# Patient Record
Sex: Female | Born: 1990 | Race: Black or African American | Hispanic: No | Marital: Married | Smoking: Former smoker
Health system: Southern US, Community
[De-identification: ages and names within clinical notes are randomized; demographics above are authoritative.]

## PROBLEM LIST (undated history)

## (undated) ENCOUNTER — Inpatient Hospital Stay (HOSPITAL_COMMUNITY): Payer: Self-pay

## (undated) DIAGNOSIS — D649 Anemia, unspecified: Secondary | ICD-10-CM

## (undated) DIAGNOSIS — Z8619 Personal history of other infectious and parasitic diseases: Secondary | ICD-10-CM

## (undated) HISTORY — DX: Anemia, unspecified: D64.9

## (undated) HISTORY — DX: Personal history of other infectious and parasitic diseases: Z86.19

---
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
## 2010-06-18 ENCOUNTER — Emergency Department (HOSPITAL_COMMUNITY): Admission: EM | Admit: 2010-06-18 | Discharge: 2010-06-18 | Payer: Self-pay | Admitting: Emergency Medicine

## 2010-07-31 ENCOUNTER — Emergency Department (HOSPITAL_COMMUNITY)
Admission: EM | Admit: 2010-07-31 | Discharge: 2010-08-01 | Payer: Self-pay | Source: Home / Self Care | Admitting: Emergency Medicine

## 2010-10-02 LAB — DIFFERENTIAL
Basophils Absolute: 0 10*3/uL (ref 0.0–0.1)
Basophils Relative: 1 % (ref 0–1)
Eosinophils Absolute: 0.1 10*3/uL (ref 0.0–0.7)
Monocytes Absolute: 0.4 10*3/uL (ref 0.1–1.0)
Neutro Abs: 1.8 10*3/uL (ref 1.7–7.7)
Neutrophils Relative %: 43 % (ref 43–77)

## 2010-10-02 LAB — COMPREHENSIVE METABOLIC PANEL
ALT: 13 U/L (ref 0–35)
Alkaline Phosphatase: 79 U/L (ref 39–117)
BUN: 10 mg/dL (ref 6–23)
Chloride: 104 mEq/L (ref 96–112)
Glucose, Bld: 93 mg/dL (ref 70–99)
Potassium: 4 mEq/L (ref 3.5–5.1)
Sodium: 138 mEq/L (ref 135–145)
Total Bilirubin: 0.4 mg/dL (ref 0.3–1.2)

## 2010-10-02 LAB — CBC
HCT: 36.1 % (ref 36.0–46.0)
Hemoglobin: 12.1 g/dL (ref 12.0–15.0)
MCV: 89.4 fL (ref 78.0–100.0)
RBC: 4.04 MIL/uL (ref 3.87–5.11)
WBC: 4.2 10*3/uL (ref 4.0–10.5)

## 2010-10-02 LAB — URINALYSIS, ROUTINE W REFLEX MICROSCOPIC
Bilirubin Urine: NEGATIVE
Glucose, UA: NEGATIVE mg/dL
Hgb urine dipstick: NEGATIVE
Specific Gravity, Urine: 1.028 (ref 1.005–1.030)
pH: 6 (ref 5.0–8.0)

## 2010-10-02 LAB — LIPASE, BLOOD: Lipase: 24 U/L (ref 11–59)

## 2010-11-01 LAB — CBC
HCT: 31.6 % — ABNORMAL LOW (ref 36.0–49.0)
Hemoglobin: 10.6 g/dL — ABNORMAL LOW (ref 12.0–16.0)
Hemoglobin: 9.8 g/dL — ABNORMAL LOW (ref 12.0–16.0)
MCHC: 33.6 g/dL (ref 31.0–37.0)
MCV: 95.7 fL (ref 78.0–98.0)
RBC: 3.05 MIL/uL — ABNORMAL LOW (ref 3.80–5.70)
RDW: 14.9 % (ref 11.4–15.5)

## 2010-11-07 ENCOUNTER — Emergency Department (HOSPITAL_COMMUNITY)
Admission: EM | Admit: 2010-11-07 | Discharge: 2010-11-08 | Disposition: A | Discharge status: Home / Self Care | Payer: Self-pay | Attending: Emergency Medicine | Admitting: Emergency Medicine

## 2010-11-07 DIAGNOSIS — R1013 Epigastric pain: Secondary | ICD-10-CM | POA: Insufficient documentation

## 2010-11-07 DIAGNOSIS — R509 Fever, unspecified: Secondary | ICD-10-CM | POA: Insufficient documentation

## 2010-11-07 DIAGNOSIS — IMO0001 Reserved for inherently not codable concepts without codable children: Secondary | ICD-10-CM | POA: Insufficient documentation

## 2010-11-07 LAB — URINALYSIS, ROUTINE W REFLEX MICROSCOPIC
Bilirubin Urine: NEGATIVE
Glucose, UA: NEGATIVE mg/dL
Hgb urine dipstick: NEGATIVE
Ketones, ur: NEGATIVE mg/dL
Nitrite: NEGATIVE
Protein, ur: NEGATIVE mg/dL
Specific Gravity, Urine: 1.024 (ref 1.005–1.030)
Urobilinogen, UA: 1 mg/dL (ref 0.0–1.0)
pH: 6 (ref 5.0–8.0)

## 2010-11-07 LAB — COMPREHENSIVE METABOLIC PANEL WITH GFR
ALT: 16 U/L (ref 0–35)
AST: 20 U/L (ref 0–37)
Albumin: 3.6 g/dL (ref 3.5–5.2)
Alkaline Phosphatase: 81 U/L (ref 39–117)
BUN: 7 mg/dL (ref 6–23)
CO2: 30 meq/L (ref 19–32)
Calcium: 9.5 mg/dL (ref 8.4–10.5)
Chloride: 107 meq/L (ref 96–112)
Creatinine, Ser: 0.89 mg/dL (ref 0.4–1.2)
GFR calc non Af Amer: >60 mL/min
Glucose, Bld: 93 mg/dL (ref 70–99)
Potassium: 4.5 meq/L (ref 3.5–5.1)
Sodium: 140 meq/L (ref 135–145)
Total Bilirubin: 0.2 mg/dL — ABNORMAL LOW (ref 0.3–1.2)
Total Protein: 7.4 g/dL (ref 6.0–8.3)

## 2010-11-07 LAB — DIFFERENTIAL
Basophils Absolute: 0 K/uL (ref 0.0–0.1)
Basophils Relative: 1 % (ref 0–1)
Eosinophils Absolute: 0.2 K/uL (ref 0.0–0.7)
Eosinophils Relative: 3 % (ref 0–5)
Lymphocytes Relative: 44 % (ref 12–46)
Lymphs Abs: 3.2 K/uL (ref 0.7–4.0)
Monocytes Absolute: 0.8 K/uL (ref 0.1–1.0)
Monocytes Relative: 11 % (ref 3–12)
Neutro Abs: 3 K/uL (ref 1.7–7.7)
Neutrophils Relative %: 41 % — ABNORMAL LOW (ref 43–77)

## 2010-11-07 LAB — CBC
HCT: 35.8 % — ABNORMAL LOW (ref 36.0–46.0)
Hemoglobin: 12.2 g/dL (ref 12.0–15.0)
MCH: 30 pg (ref 26.0–34.0)
MCHC: 34.1 g/dL (ref 30.0–36.0)
MCV: 88.2 fL (ref 78.0–100.0)
Platelets: 276 K/uL (ref 150–400)
RBC: 4.06 MIL/uL (ref 3.87–5.11)
RDW: 13.1 % (ref 11.5–15.5)
WBC: 7.2 K/uL (ref 4.0–10.5)

## 2010-11-07 LAB — LIPASE, BLOOD: Lipase: 27 U/L (ref 11–59)

## 2010-11-07 LAB — POCT PREGNANCY, URINE: Preg Test, Ur: NEGATIVE

## 2010-11-08 LAB — WET PREP, GENITAL
WBC, Wet Prep HPF POC: NONE SEEN
Yeast Wet Prep HPF POC: NONE SEEN
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
## 2011-04-12 LAB — URINALYSIS, ROUTINE W REFLEX MICROSCOPIC
Glucose, UA: NEGATIVE
Hgb urine dipstick: NEGATIVE
Protein, ur: 30 — AB
pH: 6

## 2011-04-12 LAB — POCT PREGNANCY, URINE
Operator id: 26520
Preg Test, Ur: NEGATIVE

## 2011-04-12 LAB — WET PREP, GENITAL: Clue Cells Wet Prep HPF POC: NONE SEEN

## 2011-04-12 LAB — URINE MICROSCOPIC-ADD ON

## 2012-02-24 ENCOUNTER — Emergency Department (HOSPITAL_COMMUNITY)
Admission: EM | Admit: 2012-02-24 | Discharge: 2012-02-24 | Disposition: A | Discharge status: Home / Self Care | Payer: Self-pay | Attending: Emergency Medicine | Admitting: Emergency Medicine

## 2012-02-24 ENCOUNTER — Encounter (HOSPITAL_COMMUNITY): Payer: Self-pay

## 2012-02-24 DIAGNOSIS — J020 Streptococcal pharyngitis: Secondary | ICD-10-CM | POA: Insufficient documentation

## 2012-02-24 LAB — RAPID STREP SCREEN (MED CTR MEBANE ONLY): Streptococcus, Group A Screen (Direct): POSITIVE — AB

## 2012-02-24 MED ORDER — PENICILLIN G BENZATHINE 1200000 UNIT/2ML IM SUSP
1.2000 10*6.[IU] | Freq: Once | INTRAMUSCULAR | Status: AC
  Administered 2012-02-24: 1.2 10*6.[IU] via INTRAMUSCULAR
  Filled 2012-02-24 (×2): qty 2

## 2012-02-24 MED ORDER — OXYCODONE-ACETAMINOPHEN 5-325 MG PO TABS: 2.0000 | ORAL_TABLET | ORAL | Status: AC | PRN

## 2012-02-24 NOTE — ED Provider Notes (Signed)
History  This chart was scribed for Susan Guppy, MD by Shari Heritage. The patient was seen in room TR02C/TR02C. Patient's care was started at 1446.     CSN: [id number]  Arrival date & time 02/24/12  1446   First MD Initiated Contact with Patient 02/24/12 1601      Chief Complaint  Patient presents with  . URI  . Sore Throat  . Cough  . Hemoptysis    (Consider location/radiation/quality/duration/timing/severity/associated sxs/prior treatment) The history is provided by the patient. No language interpreter was used.    Susan Everett is a 21 y.o. female who presents to the Emergency Department complaining of persistent, productive cough and sore throat onset 1.5 weeks ago. Patient says that she has produced green and bloody sputum. Associated symptoms include body aches, trouble swallowing, chills, and diaphoresis. Patient denies fever. Patient reports no other significant past medical, surgical or family history. Patient is a former smoker.   History  Substance Use Topics  . Smoking status: Former Games developer  . Smokeless tobacco: Not on file  . Alcohol Use: No    OB History    Grav Para Term Preterm Abortions TAB SAB Ect Mult Living                  Review of Systems  Constitutional: Positive for chills and diaphoresis. Negative for fever.  HENT: Positive for sore throat and trouble swallowing.   Respiratory: Positive for cough.     Allergies  Review of patient's allergies indicates not on file.  Home Medications  No current outpatient prescriptions on file.  BP 117/61  Pulse 65  Temp 98.1 F (36.7 C) (Oral)  Resp 18  SpO2 98%  Physical Exam  HENT:  Nose: Right sinus exhibits no maxillary sinus tenderness and no frontal sinus tenderness. Left sinus exhibits no maxillary sinus tenderness and no frontal sinus tenderness.       No sinus tenderness. Tonsils are erythematous, more on the right than left. There is right-sided tonsil tenderness. White pus is  present on right tonsils.  Cardiovascular: Normal rate and regular rhythm.   No murmur heard. Pulmonary/Chest: Effort normal. She has no wheezes. She has no rhonchi. She has no rales.    ED Course  Procedures (including critical care time) DIAGNOSTIC STUDIES: Oxygen Saturation is 98% on room air, normal by my interpretation.    COORDINATION OF CARE: 4:45pm- Patient informed of current plan for treatment and evaluation and agrees with plan at this time. Patient's strep screen was positive for strep throat. Will administer Bicillin injection and discharge patient home with a prescription for Percocet 3-525 mg.     Results for orders placed during the hospital encounter of 02/24/12  RAPID STREP SCREEN      Component Value Range   Streptococcus, Group A Screen (Direct) POSITIVE (*) NEGATIVE     No results found.   No diagnosis found.    MDM  Strep throat Nontoxic.  No evidence of peritonsillar abscess.  No distress      I personally performed the services described in this documentation, which was scribed in my presence. The recorded information has been reviewed and considered.     Susan Guppy, MD 02/24/12 [phone]

## 2012-02-24 NOTE — ED Notes (Signed)
Pt compalisn of body aches, sore throat, pain with swallowing, cough and cold cymptoms.

## 2014-04-12 ENCOUNTER — Encounter (HOSPITAL_COMMUNITY): Payer: Self-pay | Admitting: Emergency Medicine

## 2014-04-12 ENCOUNTER — Emergency Department (HOSPITAL_COMMUNITY)
Admission: EM | Admit: 2014-04-12 | Discharge: 2014-04-12 | Disposition: A | Discharge status: Home / Self Care | Payer: Self-pay | Attending: Emergency Medicine | Admitting: Emergency Medicine

## 2014-04-12 DIAGNOSIS — Z87891 Personal history of nicotine dependence: Secondary | ICD-10-CM | POA: Insufficient documentation

## 2014-04-12 DIAGNOSIS — IMO0002 Reserved for concepts with insufficient information to code with codable children: Secondary | ICD-10-CM | POA: Insufficient documentation

## 2014-04-12 DIAGNOSIS — Y92410 Unspecified street and highway as the place of occurrence of the external cause: Secondary | ICD-10-CM | POA: Insufficient documentation

## 2014-04-12 DIAGNOSIS — Y9389 Activity, other specified: Secondary | ICD-10-CM | POA: Insufficient documentation

## 2014-04-12 DIAGNOSIS — M546 Pain in thoracic spine: Secondary | ICD-10-CM

## 2014-04-12 MED ORDER — HYDROCODONE-ACETAMINOPHEN 5-325 MG PO TABS
2.0000 | ORAL_TABLET | Freq: Once | ORAL | Status: DC
  Filled 2014-04-12: qty 2

## 2014-04-12 NOTE — ED Provider Notes (Signed)
Medical screening examination/treatment/procedure(s) were performed by non-physician practitioner and as supervising physician I was immediately available for consultation/collaboration.   EKG Interpretation None       Doug Sou, MD 04/12/14 1623

## 2014-04-12 NOTE — Discharge Instructions (Signed)
Take ibuprofen or tylenol as needed for pain. Refer to attached documents for more information.
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
## 2014-12-18 ENCOUNTER — Encounter (HOSPITAL_COMMUNITY): Payer: Self-pay | Admitting: Emergency Medicine

## 2014-12-18 ENCOUNTER — Emergency Department (HOSPITAL_COMMUNITY)
Admission: EM | Admit: 2014-12-18 | Discharge: 2014-12-19 | Disposition: A | Discharge status: Home / Self Care | Payer: BC Managed Care – PPO | Attending: Emergency Medicine | Admitting: Emergency Medicine

## 2014-12-18 DIAGNOSIS — R51 Headache: Secondary | ICD-10-CM | POA: Diagnosis not present

## 2014-12-18 DIAGNOSIS — R202 Paresthesia of skin: Secondary | ICD-10-CM | POA: Insufficient documentation

## 2014-12-18 DIAGNOSIS — R1013 Epigastric pain: Secondary | ICD-10-CM | POA: Insufficient documentation

## 2014-12-18 DIAGNOSIS — R0789 Other chest pain: Secondary | ICD-10-CM | POA: Diagnosis not present

## 2014-12-18 DIAGNOSIS — R109 Unspecified abdominal pain: Secondary | ICD-10-CM

## 2014-12-18 DIAGNOSIS — Z87891 Personal history of nicotine dependence: Secondary | ICD-10-CM | POA: Insufficient documentation

## 2014-12-18 DIAGNOSIS — R079 Chest pain, unspecified: Secondary | ICD-10-CM | POA: Diagnosis present

## 2014-12-18 DIAGNOSIS — R519 Headache, unspecified: Secondary | ICD-10-CM

## 2014-12-18 NOTE — ED Notes (Addendum)
Pt arrived to the ED with a complaint of feeling "weird."  Pt states her chest is a tight sensation. Pt denies pain radiation.  Pt's pain is located central chest. Pain has been intermittent.  Pt also is complaining of a headache

## 2014-12-19 ENCOUNTER — Emergency Department (HOSPITAL_COMMUNITY): Payer: BC Managed Care – PPO

## 2014-12-19 LAB — COMPREHENSIVE METABOLIC PANEL
ALT: 19 U/L (ref 14–54)
ANION GAP: 4 — AB (ref 5–15)
AST: 15 U/L (ref 15–41)
Albumin: 3.7 g/dL (ref 3.5–5.0)
Alkaline Phosphatase: 79 U/L (ref 38–126)
BILIRUBIN TOTAL: 0.5 mg/dL (ref 0.3–1.2)
BUN: 9 mg/dL (ref 6–20)
CALCIUM: 8.6 mg/dL — AB (ref 8.9–10.3)
CHLORIDE: 107 mmol/L (ref 101–111)
CO2: 26 mmol/L (ref 22–32)
Creatinine, Ser: 0.79 mg/dL (ref 0.44–1.00)
GLUCOSE: 98 mg/dL (ref 65–99)
POTASSIUM: 3.8 mmol/L (ref 3.5–5.1)
Sodium: 137 mmol/L (ref 135–145)
Total Protein: 6.6 g/dL (ref 6.5–8.1)

## 2014-12-19 LAB — CBC WITH DIFFERENTIAL/PLATELET
Basophils Absolute: 0 10*3/uL (ref 0.0–0.1)
Basophils Relative: 0 % (ref 0–1)
EOS PCT: 2 % (ref 0–5)
Eosinophils Absolute: 0.1 10*3/uL (ref 0.0–0.7)
HCT: 31.9 % — ABNORMAL LOW (ref 36.0–46.0)
HEMOGLOBIN: 10.6 g/dL — AB (ref 12.0–15.0)
Lymphocytes Relative: 54 % — ABNORMAL HIGH (ref 12–46)
Lymphs Abs: 2.4 10*3/uL (ref 0.7–4.0)
MCH: 29.1 pg (ref 26.0–34.0)
MCHC: 33.2 g/dL (ref 30.0–36.0)
MCV: 87.6 fL (ref 78.0–100.0)
MONOS PCT: 10 % (ref 3–12)
Monocytes Absolute: 0.4 10*3/uL (ref 0.1–1.0)
NEUTROS ABS: 1.5 10*3/uL — AB (ref 1.7–7.7)
NEUTROS PCT: 34 % — AB (ref 43–77)
Platelets: 190 10*3/uL (ref 150–400)
RBC: 3.64 MIL/uL — ABNORMAL LOW (ref 3.87–5.11)
RDW: 13.5 % (ref 11.5–15.5)
WBC: 4.4 10*3/uL (ref 4.0–10.5)

## 2014-12-19 LAB — D-DIMER, QUANTITATIVE: D-Dimer, Quant: <0.27 ug/mL-FEU (ref 0.00–0.48)

## 2014-12-19 LAB — TROPONIN I

## 2014-12-19 NOTE — Discharge Instructions (Signed)
Your tests tonight are normal, no heart attack, blood clots, pneumonia, abnormal kidney or liver tests or electrolyte abnormalities. You can take acetaminophen for your headache which shouldn't bother your stomach. Recheck if you feel worse.

## 2014-12-19 NOTE — ED Provider Notes (Signed)
CSN: [id number]     Arrival date & time 12/18/14  2203 History   First MD Initiated Contact with Patient 12/18/14 2359     Chief Complaint  Patient presents with  . Chest Pain     (Consider location/radiation/quality/duration/timing/severity/associated sxs/prior Treatment) HPI  Patient is here with several different complaints. She reports headache off and on for the past week. The headaches occur daily and last all day. She states currently the headache is in her center forehead. She also gets in the back of her head. She describes the pain as pressure. Nontender when she palpates it. She also complains of some swelling behind her left ear that got very swollen and now the swelling went away. She denies any visual changes or numbness or tingling of her extremities. She does state she gets tingling in her legs with her. That has gone on for a long time but not tonight.  Patient states about 2 hours prior to arrival she had chest pain that was just to the right of center. She described it as pressure and it would come and go lasting a few minutes at a time. Nothing made it feel worse, nothing made it feel better. She denies cough and states once she may have had some shortness of breath. She states she felt like she was having palpitations earlier. She denies nausea or vomiting. After the chest pain started she had some epigastric pain described as sharp that comes and goes.  Patient states she was taken aspirin for her headache earlier in the week and started getting diarrhea and diffuse burning abdominal pain. This lasted for about 3 hours. It is not present now.   PCP none  History reviewed. No pertinent past medical history. History reviewed. No pertinent past surgical history. History reviewed. No pertinent family history. History  Substance Use Topics  . Smoking status: Former Smoker    Quit date: 07/22/2010  . Smokeless tobacco: Not on file  . Alcohol Use: No   employed  OB  History    No data available     Review of Systems  All other systems reviewed and are negative.     Allergies  Review of patient's allergies indicates no known allergies.  Home Medications   Prior to Admission medications   Not on File   Pt denies being on BCP's  BP 116/69 mmHg  Pulse 69  Temp(Src) 97.6 F (36.4 C) (Oral)  Resp 18  Ht 5\' 4"  (1.626 m)  Wt 152 lb (68.947 kg)  BMI 26.08 kg/m2  SpO2 100%  LMP 12/12/2014 (Approximate)  Vital signs normal   Physical Exam  Constitutional: She is oriented to person, place, and time. She appears well-developed and well-nourished.  Non-toxic appearance. She does not appear ill. No distress.  HENT:  Head: Normocephalic and atraumatic.    Right Ear: External ear normal.  Left Ear: External ear normal.  Nose: Nose normal. No mucosal edema or rhinorrhea.  Mouth/Throat: Oropharynx is clear and moist and mucous membranes are normal. No dental abscesses or uvula swelling.  Area of headache noted  Patient's postauricular area was inspected bilaterally. They feel the same with the same bony prominences. She states it was very swollen and gone now. We discussed it may have been a cyst because of lymph node would not get large and go away that quickly.  Eyes: Conjunctivae and EOM are normal. Pupils are equal, round, and reactive to light.  Neck: Normal range of motion and full passive range  of motion without pain. Neck supple.  Cardiovascular: Normal rate, regular rhythm and normal heart sounds.  Exam reveals no gallop and no friction rub.   No murmur heard. Pulmonary/Chest: Effort normal and breath sounds normal. No respiratory distress. She has no wheezes. She has no rhonchi. She has no rales. She exhibits tenderness. She exhibits no crepitus.    Abdominal: Soft. Normal appearance and bowel sounds are normal. She exhibits no distension. There is no tenderness. There is no rebound and no guarding.  Musculoskeletal: Normal range of  motion. She exhibits no edema or tenderness.  Moves all extremities well.   Neurological: She is alert and oriented to person, place, and time. She has normal strength. No cranial nerve deficit.  Skin: Skin is warm, dry and intact. No rash noted. No erythema. No pallor.  Psychiatric: She has a normal mood and affect. Her speech is normal and behavior is normal. Her mood appears not anxious.  Nursing note and vitals reviewed.   ED Course  Procedures (including critical care time)  Pt refused any kind of medication because of the aspirin upsetting her stomach earlier this week. She doesn't want the migraine cocktail which I explained was nausea medication.  Pt given her test results, she again refuses to try any medications.   Labs Review Results for orders placed or performed during the hospital encounter of 12/18/14  Comprehensive metabolic panel  Result Value Ref Range   Sodium 137 135 - 145 mmol/L   Potassium 3.8 3.5 - 5.1 mmol/L   Chloride 107 101 - 111 mmol/L   CO2 26 22 - 32 mmol/L   Glucose, Bld 98 65 - 99 mg/dL   BUN 9 6 - 20 mg/dL   Creatinine, Ser 1.61 0.44 - 1.00 mg/dL   Calcium 8.6 (L) 8.9 - 10.3 mg/dL   Total Protein 6.6 6.5 - 8.1 g/dL   Albumin 3.7 3.5 - 5.0 g/dL   AST 15 15 - 41 U/L   ALT 19 14 - 54 U/L   Alkaline Phosphatase 79 38 - 126 U/L   Total Bilirubin 0.5 0.3 - 1.2 mg/dL   GFR calc non Af Amer >60 >60 mL/min   GFR calc Af Amer >60 >60 mL/min   Anion gap 4 (L) 5 - 15  CBC with Differential  Result Value Ref Range   WBC 4.4 4.0 - 10.5 K/uL   RBC 3.64 (L) 3.87 - 5.11 MIL/uL   Hemoglobin 10.6 (L) 12.0 - 15.0 g/dL   HCT 09.6 (L) 04.5 - 40.9 %   MCV 87.6 78.0 - 100.0 fL   MCH 29.1 26.0 - 34.0 pg   MCHC 33.2 30.0 - 36.0 g/dL   RDW 81.1 91.4 - 78.2 %   Platelets 190 150 - 400 K/uL   Neutrophils Relative % 34 (L) 43 - 77 %   Neutro Abs 1.5 (L) 1.7 - 7.7 K/uL   Lymphocytes Relative 54 (H) 12 - 46 %   Lymphs Abs 2.4 0.7 - 4.0 K/uL   Monocytes Relative 10  3 - 12 %   Monocytes Absolute 0.4 0.1 - 1.0 K/uL   Eosinophils Relative 2 0 - 5 %   Eosinophils Absolute 0.1 0.0 - 0.7 K/uL   Basophils Relative 0 0 - 1 %   Basophils Absolute 0.0 0.0 - 0.1 K/uL  Troponin I  Result Value Ref Range   Troponin I <0.03 <0.031 ng/mL  D-dimer, quantitative  Result Value Ref Range   D-Dimer, Quant <0.27 0.00 -  0.48 ug/mL-FEU   Laboratory interpretation all normal except mild anemia       Imaging Review Dg Chest 2 View  12/19/2014   CLINICAL DATA:  Central non radiating chest pain for 1 day.  EXAM: CHEST  2 VIEW  COMPARISON:  None.  FINDINGS: The cardiomediastinal contours are normal. The lungs are clear. Pulmonary vasculature is normal. No consolidation, pleural effusion, or pneumothorax. No acute osseous abnormalities are seen.  IMPRESSION: No acute pulmonary process.   Electronically Signed   By: Rubye Oaks M.D.   On: 12/19/2014 03:04     EKG Interpretation   Date/Time:  Sunday Dec 18 2014 22:11:37 EDT Ventricular Rate:  64 PR Interval:  159 QRS Duration: 103 QT Interval:  389 QTC Calculation: 401 R Axis:   81 Text Interpretation:  Sinus rhythm Normal ECG No old tracing to compare  Confirmed by Cheryel Kyte  MD-I, Averie Hornbaker ([id number]) on 12/19/2014 2:23:39 AM        MDM   Final diagnoses:  Headache, unspecified headache type  Chest pain, atypical  Abdominal pain, unspecified abdominal location    Plan discharge   Devoria Albe, MD, Concha Pyo, MD 12/19/14 [phone]

## 2016-04-04 LAB — OB RESULTS CONSOLE GC/CHLAMYDIA
Chlamydia: NEGATIVE
Gonorrhea: NEGATIVE

## 2016-04-04 LAB — OB RESULTS CONSOLE RUBELLA ANTIBODY, IGM: Rubella: IMMUNE

## 2016-04-04 LAB — OB RESULTS CONSOLE ANTIBODY SCREEN: Antibody Screen: NEGATIVE

## 2016-04-04 LAB — OB RESULTS CONSOLE ABO/RH: RH TYPE: NEGATIVE

## 2016-04-04 LAB — OB RESULTS CONSOLE HIV ANTIBODY (ROUTINE TESTING): HIV: NONREACTIVE

## 2016-04-04 LAB — OB RESULTS CONSOLE HEPATITIS B SURFACE ANTIGEN: Hepatitis B Surface Ag: NEGATIVE

## 2016-04-04 LAB — OB RESULTS CONSOLE RPR: RPR: NONREACTIVE

## 2016-07-22 NOTE — L&D Delivery Note (Signed)
Delivery Note At 7:49 PM a viable female was delivered via Vaginal, Spontaneous Delivery (Presentation: vtx; LOA ).  APGAR: 9, 9; weight pending.   Placenta status: spontaneous, intact.  Cord:  with the following complications: none.  Some postpartum bleeding responded to uterine massage and pitocin.  No perineal or vaginal lacerations, also able to visualize entire cervix-no lacs.  Anesthesia:  Epidural Episiotomy: None Lacerations: None Suture Repair: none Est. Blood Loss (mL): 400  Mom to postpartum.  Baby to Couplet care / Skin to Skin.  Owais Pruett D 10/14/2016, 8:24 PM

## 2016-08-05 ENCOUNTER — Ambulatory Visit (HOSPITAL_BASED_OUTPATIENT_CLINIC_OR_DEPARTMENT_OTHER): Payer: BC Managed Care – PPO

## 2016-08-05 ENCOUNTER — Ambulatory Visit (HOSPITAL_BASED_OUTPATIENT_CLINIC_OR_DEPARTMENT_OTHER): Payer: BC Managed Care – PPO | Admitting: Oncology

## 2016-08-05 ENCOUNTER — Telehealth: Payer: Self-pay | Admitting: Oncology

## 2016-08-05 VITALS — BP 135/59 | HR 91 | Temp 98.1°F | Ht 64.0 in | Wt 216.7 lb

## 2016-08-05 DIAGNOSIS — D649 Anemia, unspecified: Secondary | ICD-10-CM

## 2016-08-05 DIAGNOSIS — O99013 Anemia complicating pregnancy, third trimester: Secondary | ICD-10-CM

## 2016-08-05 LAB — CBC WITH DIFFERENTIAL/PLATELET
BASO%: 0.1 % (ref 0.0–2.0)
Basophils Absolute: 0 10*3/uL (ref 0.0–0.1)
EOS%: 1.2 % (ref 0.0–7.0)
Eosinophils Absolute: 0.1 10*3/uL (ref 0.0–0.5)
HEMATOCRIT: 28.3 % — AB (ref 34.8–46.6)
HGB: 9.3 g/dL — ABNORMAL LOW (ref 11.6–15.9)
LYMPH#: 1.4 10*3/uL (ref 0.9–3.3)
LYMPH%: 16.5 % (ref 14.0–49.7)
MCH: 29.4 pg (ref 25.1–34.0)
MCHC: 32.9 g/dL (ref 31.5–36.0)
MCV: 89.6 fL (ref 79.5–101.0)
MONO#: 0.7 10*3/uL (ref 0.1–0.9)
MONO%: 8 % (ref 0.0–14.0)
NEUT%: 74.2 % (ref 38.4–76.8)
NEUTROS ABS: 6.3 10*3/uL (ref 1.5–6.5)
Platelets: 194 10*3/uL (ref 145–400)
RBC: 3.16 10*6/uL — ABNORMAL LOW (ref 3.70–5.45)
RDW: 15 % — ABNORMAL HIGH (ref 11.2–14.5)
WBC: 8.5 10*3/uL (ref 3.9–10.3)

## 2016-08-05 LAB — FERRITIN: Ferritin: 11 ng/ml (ref 9–269)

## 2016-08-05 LAB — COMPREHENSIVE METABOLIC PANEL
ALBUMIN: 2.8 g/dL — AB (ref 3.5–5.0)
ALK PHOS: 148 U/L (ref 40–150)
ALT: 20 U/L (ref 0–55)
AST: 15 U/L (ref 5–34)
Anion Gap: 8 mEq/L (ref 3–11)
BUN: 6.1 mg/dL — ABNORMAL LOW (ref 7.0–26.0)
CALCIUM: 8.8 mg/dL (ref 8.4–10.4)
CO2: 23 mEq/L (ref 22–29)
Chloride: 108 mEq/L (ref 98–109)
Creatinine: 0.7 mg/dL (ref 0.6–1.1)
GLUCOSE: 99 mg/dL (ref 70–140)
POTASSIUM: 3.8 meq/L (ref 3.5–5.1)
SODIUM: 138 meq/L (ref 136–145)
Total Bilirubin: 0.29 mg/dL (ref 0.20–1.20)
Total Protein: 6.5 g/dL (ref 6.4–8.3)

## 2016-08-05 LAB — IRON AND TIBC
%SAT: 9 % — ABNORMAL LOW (ref 21–57)
IRON: 41 ug/dL (ref 41–142)
TIBC: 451 ug/dL — AB (ref 236–444)
UIBC: 410 ug/dL — ABNORMAL HIGH (ref 120–384)

## 2016-08-05 NOTE — Telephone Encounter (Signed)
GAVE PATIENT AVS REPORT AND APPOINTMENTS FOR January. PATIENT BACK TO LAB.

## 2016-08-05 NOTE — Progress Notes (Signed)
[facility] Cancer Initial Visit:  Patient Care Team: No Pcp Per Patient as PCP - General (General Practice)  CHIEF COMPLAINTS/PURPOSE OF CONSULTATION:  Anemia in pregnancy  HISTORY OF PRESENTING ILLNESS: Susan Everett 25 y.o. female presented today for evaluation of anemia in the setting of pregnancy. Patient is currently in her seventh month of pregnancy. She went to see her OB recently for follow-up exam and had a CBC performed on 07/26/16 which demonstrated a hemoglobin 9.1 g/dL, hematocrit 16.1%28.6%, MCV 90. Patient has a history of anemia, her previous CBC from 12/19/14 demonstrated hemoglobin 10.6 g/dL, hematocrit 09.6%31.9%, MCV 87.6. She denies any evidence of bleeding including melena, hematochezia, hematuria, hemoptysis. She is currently not on any iron supplement. She is supposed to be taking a prenatal vitamin however due to difficulty swallowing the pill, she is taking  Flintstone vitamins instead. She denies any family or personal history of thalassemia or sickle cell disease/trait. She denies any fatigue. She states she does get short of breath when she lays flat but denies any dyspnea on exertion or chest pain. Today she complains of right hip pain which has been going off for the past few weeks.  Review of Systems  Constitutional: Negative for appetite change, chills, fatigue and fever.  HENT:   Negative for hearing loss, lump/mass, mouth sores, sore throat and tinnitus.   Eyes: Negative for eye problems and icterus.  Respiratory: Negative for chest tightness, cough, hemoptysis, shortness of breath and wheezing.        SOB while laying flat.  Cardiovascular: Negative for chest pain, leg swelling and palpitations.  Gastrointestinal: Negative for abdominal distention, abdominal pain, blood in stool, diarrhea, nausea and vomiting.  Endocrine: Negative.  Negative for hot flashes.  Genitourinary: Negative for difficulty urinating, frequency and hematuria.   Musculoskeletal:  Negative for arthralgias and neck pain.       +right hip pain  Skin: Negative for itching and rash.  Neurological: Negative for dizziness, headaches and speech difficulty.  Hematological: Negative for adenopathy. Does not bruise/bleed easily.  Psychiatric/Behavioral: Negative for confusion. The patient is not nervous/anxious.     MEDICAL HISTORY: No past medical history on file.  SURGICAL HISTORY: No past surgical history on file.  SOCIAL HISTORY: Social History   Social History  . Marital status: Single    Spouse name: N/A  . Number of children: N/A  . Years of education: N/A   Occupational History  . Not on file.   Social History Main Topics  . Smoking status: Former Smoker    Quit date: 07/22/2010  . Smokeless tobacco: Not on file  . Alcohol use No  . Drug use: No  . Sexual activity: Yes    Birth control/ protection: None   Other Topics Concern  . Not on file   Social History Narrative  . No narrative on file    FAMILY HISTORY No family history on file.  ALLERGIES:  has No Known Allergies.  MEDICATIONS:  Current Outpatient Prescriptions  Medication Sig Dispense Refill  . Prenatal Vit-Fe Fumarate-FA (PRENATAL MULTIVITAMIN) TABS tablet Take 1 tablet by mouth daily at 12 noon.     No current facility-administered medications for this visit.     PHYSICAL EXAMINATION:    Vitals:   08/05/16 1317  BP: (!) 135/59  Pulse: 91  Temp: 98.1 F (36.7 C)    Filed Weights   08/05/16 1317  Weight: 216 lb 11.2 oz (98.3 kg)     Physical Exam  Constitutional:  She is oriented to person, place, and time and well-developed, well-nourished, and in no distress. No distress.  HENT:  Head: Normocephalic and atraumatic.  Mouth/Throat: No oropharyngeal exudate.  Eyes: Conjunctivae are normal. Pupils are equal, round, and reactive to light. No scleral icterus.  Neck: Normal range of motion. Neck supple. No JVD present.  Cardiovascular: Normal rate, regular rhythm  and normal heart sounds.  Exam reveals no gallop and no friction rub.   No murmur heard. Pulmonary/Chest: Breath sounds normal. No respiratory distress. She has no wheezes. She has no rales.  Abdominal: Soft. Bowel sounds are normal. She exhibits no distension. There is no tenderness. There is no guarding.  Gravid abdomen  Musculoskeletal: She exhibits no edema or tenderness.  Lymphadenopathy:    She has no cervical adenopathy.  Neurological: She is alert and oriented to person, place, and time. No cranial nerve deficit.  Skin: Skin is warm and dry. No rash noted. No erythema. No pallor.  Psychiatric: Affect and judgment normal.     LABORATORY DATA: I have personally reviewed the data as listed:  Appointment on 08/05/2016  Component Date Value Ref Range Status  . WBC 08/05/2016 8.5  3.9 - 10.3 10e3/uL Final  . NEUT# 08/05/2016 6.3  1.5 - 6.5 10e3/uL Final  . HGB 08/05/2016 9.3* 11.6 - 15.9 g/dL Final  . HCT 04/54/0981 28.3* 34.8 - 46.6 % Final  . Platelets 08/05/2016 194  145 - 400 10e3/uL Final  . MCV 08/05/2016 89.6  79.5 - 101.0 fL Final  . MCH 08/05/2016 29.4  25.1 - 34.0 pg Final  . MCHC 08/05/2016 32.9  31.5 - 36.0 g/dL Final  . RBC 19/14/7829 3.16* 3.70 - 5.45 10e6/uL Final  . RDW 08/05/2016 15.0* 11.2 - 14.5 % Final  . lymph# 08/05/2016 1.4  0.9 - 3.3 10e3/uL Final  . MONO# 08/05/2016 0.7  0.1 - 0.9 10e3/uL Final  . Eosinophils Absolute 08/05/2016 0.1  0.0 - 0.5 10e3/uL Final  . Basophils Absolute 08/05/2016 0.0  0.0 - 0.1 10e3/uL Final  . NEUT% 08/05/2016 74.2  38.4 - 76.8 % Final  . LYMPH% 08/05/2016 16.5  14.0 - 49.7 % Final  . MONO% 08/05/2016 8.0  0.0 - 14.0 % Final  . EOS% 08/05/2016 1.2  0.0 - 7.0 % Final  . BASO% 08/05/2016 0.1  0.0 - 2.0 % Final    RADIOGRAPHIC STUDIES: I have personally reviewed the radiological images as listed and agree with the findings in the report  No results found.  ASSESSMENT/PLAN  26 year old female in her third trimester of  pregnancy presented today for evaluation of anemia.  There is a normal physiologic anemia that occurs especially in the third trimester pregnancy due to plasma volume expansion. However I will rule out other causes for anemia with labs as stated below.  Return to clinic in 1 week to review lab results and to discuss his next plan of care.   Orders Placed This Encounter  Procedures  . CBC with Differential    Standing Status:   Future    Number of Occurrences:   1    Standing Expiration Date:   08/05/2017  . Ferritin    Standing Status:   Future    Number of Occurrences:   1    Standing Expiration Date:   08/05/2017  . Iron and TIBC    Standing Status:   Future    Number of Occurrences:   1    Standing Expiration Date:   08/05/2017  .  Folate, Serum    Standing Status:   Future    Number of Occurrences:   1    Standing Expiration Date:   08/05/2017  . Vitamin B12    Standing Status:   Future    Number of Occurrences:   1    Standing Expiration Date:   08/05/2017  . Hemoglobinopathy evaluation    Standing Status:   Future    Number of Occurrences:   1    Standing Expiration Date:   08/05/2017  . Methylmalonic acid, serum    Standing Status:   Future    Number of Occurrences:   1    Standing Expiration Date:   08/05/2017  . Haptoglobin    Standing Status:   Future    Number of Occurrences:   1    Standing Expiration Date:   08/05/2017  . Comprehensive metabolic panel    Standing Status:   Future    Number of Occurrences:   1    Standing Expiration Date:   08/05/2017  . Direct antiglobulin test (Coombs)    Standing Status:   Future    Number of Occurrences:   1    Standing Expiration Date:   08/05/2017    All questions were answered. The patient knows to call the clinic with any problems, questions or concerns.  This note was electronically signed.    Ralene Cork, MD  08/05/2016 2:26 PM

## 2016-08-06 LAB — DIRECT ANTIGLOBULIN TEST (NOT AT ARMC): COOMBS', DIRECT: NEGATIVE

## 2016-08-06 LAB — VITAMIN B12: Vitamin B12: 379 pg/mL (ref 232–1245)

## 2016-08-06 LAB — FOLATE: Folate: >20 ng/mL (ref >3.0)

## 2016-08-06 LAB — HAPTOGLOBIN: Haptoglobin: 68 mg/dL (ref 34–200)

## 2016-08-07 LAB — HEMOGLOBINOPATHY EVALUATION
HEMOGLOBIN F QUANTITATION: 0 % (ref 0.0–2.0)
HGB C: 0 %
HGB S: 0 %
HGB VARIANT: 0 %
Hemoglobin A2 Quantitation: 2.1 % (ref 1.8–3.2)
Hgb A: 97.9 % (ref 96.4–98.8)

## 2016-08-08 LAB — METHYLMALONIC ACID, SERUM: Methylmalonic Acid, Serum: 131 nmol/L (ref 0–378)

## 2016-08-14 ENCOUNTER — Telehealth: Payer: Self-pay | Admitting: Oncology

## 2016-08-14 ENCOUNTER — Ambulatory Visit (HOSPITAL_BASED_OUTPATIENT_CLINIC_OR_DEPARTMENT_OTHER): Payer: BC Managed Care – PPO | Admitting: Oncology

## 2016-08-14 VITALS — BP 116/59 | HR 95 | Temp 98.5°F | Resp 18 | Wt 218.5 lb

## 2016-08-14 DIAGNOSIS — D509 Iron deficiency anemia, unspecified: Secondary | ICD-10-CM | POA: Diagnosis not present

## 2016-08-14 DIAGNOSIS — D508 Other iron deficiency anemias: Secondary | ICD-10-CM

## 2016-08-14 NOTE — Progress Notes (Signed)
[facility] Cancer Follow up:    No PCP Per Patient No address on file   DIAGNOSIS: Anemia  SUMMARY OF HEMATOLOGY HISTORY: 08/05/16: Susan Everett [place] 25 y.o. female presented today for evaluation of anemia in the setting of pregnancy. Patient is currently in her seventh month of pregnancy. She went to see her OB recently for follow-up exam and had a CBC performed on 07/26/16 which demonstrated a hemoglobin 9.1 g/dL, hematocrit 16.1%, MCV 90. Patient has a history of anemia, her previous CBC from 12/19/14 demonstrated hemoglobin 10.6 g/dL, hematocrit 09.6%, MCV 87.6. She denies any evidence of bleeding including melena, hematochezia, hematuria, hemoptysis. She is currently not on any iron supplement. She is supposed to be taking a prenatal vitamin however due to difficulty swallowing the pill, she is taking  Flintstone vitamins instead.   INTERVAL HISTORY: Susan Everett 25 y.o. female Presented today for continued follow-up for her anemia and to review her anemia labs performed on 08/05/16. CBC demonstrated WBC 8.5K, hemoglobin 9 pg/dL, hematocrit 04.5%, MCV 89.6, platelet count 194K. Hemoglobin electrophoresis did not demonstrate any hemoglobinopathy. Vitamin B12 was 379, folate greater than 20. Iron studies demonstrated serum iron 41, TIBC 451, iron saturation 9%, ferritin 11. Coombs test was negative. Haptoglobin within normal limits. The patient states that she feels well and has no complaints.   Patient Active Problem List   Diagnosis Date Noted  . Anemia affecting pregnancy in third trimester 08/05/2016    has No Known Allergies.  MEDICAL HISTORY: History reviewed. No pertinent past medical history.  SURGICAL HISTORY: History reviewed. No pertinent surgical history.  SOCIAL HISTORY: Social History   Social History  . Marital status: Single    Spouse name: N/A  . Number of children: N/A  . Years of education: N/A   Occupational History  . Not on file.   Social  History Main Topics  . Smoking status: Former Smoker    Quit date: 07/22/2010  . Smokeless tobacco: Former Neurosurgeon  . Alcohol use No  . Drug use: No  . Sexual activity: Yes    Birth control/ protection: None   Other Topics Concern  . Not on file   Social History Narrative  . No narrative on file    FAMILY HISTORY: History reviewed. No pertinent family history.  Review of Systems  Constitutional: Negative for appetite change, chills, fatigue and fever.  HENT:   Negative for hearing loss, lump/mass, mouth sores, sore throat and tinnitus.   Eyes: Negative for eye problems and icterus.  Respiratory: Negative for chest tightness, cough, hemoptysis, shortness of breath and wheezing.   Cardiovascular: Negative for chest pain, leg swelling and palpitations.  Gastrointestinal: Negative for abdominal distention, abdominal pain, blood in stool, diarrhea, nausea and vomiting.  Endocrine: Negative.  Negative for hot flashes.  Genitourinary: Negative for difficulty urinating, frequency and hematuria.   Musculoskeletal: Negative for arthralgias and neck pain.  Skin: Negative for itching and rash.  Neurological: Negative for dizziness, headaches and speech difficulty.  Hematological: Negative for adenopathy. Does not bruise/bleed easily.  Psychiatric/Behavioral: Negative for confusion. The patient is not nervous/anxious.       PHYSICAL EXAMINATION  Vitals:   08/14/16 0915  BP: (!) 116/59  Pulse: 95  Resp: 18  Temp: 98.5 F (36.9 C)    Physical Exam  Constitutional: She is oriented to person, place, and time and well-developed, well-nourished, and in no distress. No distress.  HENT:  Head: Normocephalic and atraumatic.  Mouth/Throat: No oropharyngeal exudate.  Eyes:  Conjunctivae are normal. Pupils are equal, round, and reactive to light. No scleral icterus.  Neck: Normal range of motion. Neck supple. No JVD present.  Cardiovascular: Normal rate, regular rhythm and normal heart sounds.   Exam reveals no gallop and no friction rub.   No murmur heard. Pulmonary/Chest: Breath sounds normal. No respiratory distress. She has no wheezes. She has no rales.  Abdominal: Soft. Bowel sounds are normal. She exhibits no distension. There is no tenderness. There is no guarding.  Gravid abdomen  Musculoskeletal: She exhibits no edema or tenderness.  Lymphadenopathy:    She has no cervical adenopathy.  Neurological: She is alert and oriented to person, place, and time. No cranial nerve deficit.  Skin: Skin is warm and dry. No rash noted. No erythema. No pallor.  Psychiatric: Affect and judgment normal.    LABORATORY DATA:  CBC    Component Value Date/Time   WBC 8.5 08/05/2016 1402   WBC 4.4 12/19/2014 0247   RBC 3.16 (L) 08/05/2016 1402   RBC 3.64 (L) 12/19/2014 0247   HGB 9.3 (L) 08/05/2016 1402   HCT 28.3 (L) 08/05/2016 1402   PLT 194 08/05/2016 1402   MCV 89.6 08/05/2016 1402   MCH 29.4 08/05/2016 1402   MCH 29.1 12/19/2014 0247   MCHC 32.9 08/05/2016 1402   MCHC 33.2 12/19/2014 0247   RDW 15.0 (H) 08/05/2016 1402   LYMPHSABS 1.4 08/05/2016 1402   MONOABS 0.7 08/05/2016 1402   EOSABS 0.1 08/05/2016 1402   BASOSABS 0.0 08/05/2016 1402    CMP     Component Value Date/Time   NA 138 08/05/2016 1404   K 3.8 08/05/2016 1404   CL 107 12/19/2014 0247   CO2 23 08/05/2016 1404   GLUCOSE 99 08/05/2016 1404   BUN 6.1 (L) 08/05/2016 1404   CREATININE 0.7 08/05/2016 1404   CALCIUM 8.8 08/05/2016 1404   PROT 6.5 08/05/2016 1404   ALBUMIN 2.8 (L) 08/05/2016 1404   AST 15 08/05/2016 1404   ALT 20 08/05/2016 1404   ALKPHOS 148 08/05/2016 1404   BILITOT 0.29 08/05/2016 1404   GFRNONAA >60 12/19/2014 0247   GFRAA >60 12/19/2014 0247       PENDING LABS:   RADIOGRAPHIC STUDIES:  No results found.     ASSESSMENT:  26 year old female in her third trimester pregnancy presented for continued follow-up for her anemia. Anemia workup demonstrated an iron deficiency  with a ferritin of 11.  PLAN: I have reviewed patient's lab work in detail with her today. She has evidence of an iron deficiency anemia.  I have discussed IV iron replacement with feraheme in detail with the patient today including dosage: Administration, side effects patient is agreeable to proceeding. I will get her set up for feraheme.  Return to clinic in 8 weeks for follow up with repeat CBC, CNP, iron studies 1-2 days prior to her next visit.  Orders Placed This Encounter  Procedures  . CBC with Differential    Standing Status:   Future    Standing Expiration Date:   08/14/2017  . Iron and TIBC    Standing Status:   Future    Standing Expiration Date:   08/14/2017  . Ferritin    Standing Status:   Future    Standing Expiration Date:   08/14/2017    All questions were answered. The patient knows to call the clinic with any problems, questions or concerns. We can certainly see the patient much sooner if necessary. This note was electronically  signed. Ralene CorkLouise Zamara Cozad, MD 08/14/2016

## 2016-08-14 NOTE — Telephone Encounter (Signed)
Gave patient avs report and appointments for January thru March.  °

## 2016-08-21 ENCOUNTER — Ambulatory Visit (HOSPITAL_BASED_OUTPATIENT_CLINIC_OR_DEPARTMENT_OTHER): Payer: BC Managed Care – PPO

## 2016-08-21 VITALS — BP 128/68 | HR 90 | Temp 97.7°F | Resp 18

## 2016-08-21 DIAGNOSIS — D509 Iron deficiency anemia, unspecified: Secondary | ICD-10-CM

## 2016-08-21 DIAGNOSIS — O99013 Anemia complicating pregnancy, third trimester: Secondary | ICD-10-CM

## 2016-08-21 MED ORDER — SODIUM CHLORIDE 0.9 % IV SOLN
Freq: Once | INTRAVENOUS | Status: AC
  Administered 2016-08-21: 09:00:00 via INTRAVENOUS

## 2016-08-21 MED ORDER — SODIUM CHLORIDE 0.9 % IV SOLN
510.0000 mg | Freq: Once | INTRAVENOUS | Status: AC
  Administered 2016-08-21: 510 mg via INTRAVENOUS
  Filled 2016-08-21: qty 17

## 2016-08-21 NOTE — Progress Notes (Signed)
RROB visit for patient during iron infusion at [facility].  Patient states baby is moving well and has no OB complaints.  Criteria met for reactive NST.  FHR baseline was 145 with accels to the 170's for 15 seconds.  Fetal tracing reviewed and signed by Dr. Terri Piedra.

## 2016-08-21 NOTE — Patient Instructions (Signed)
Ferumoxytol injection What is this medicine? FERUMOXYTOL is an iron complex. Iron is used to make healthy red blood cells, which carry oxygen and nutrients throughout the body. This medicine is used to treat iron deficiency anemia in people with chronic kidney disease. COMMON BRAND NAME(S): Feraheme What should I tell my health care provider before I take this medicine? They need to know if you have any of these conditions: -anemia not caused by low iron levels -high levels of iron in the blood -magnetic resonance imaging (MRI) test scheduled -an unusual or allergic reaction to iron, other medicines, foods, dyes, or preservatives -pregnant or trying to get pregnant -breast-feeding How should I use this medicine? This medicine is for injection into a vein. It is given by a health care professional in a hospital or clinic setting. Talk to your pediatrician regarding the use of this medicine in children. Special care may be needed. What if I miss a dose? It is important not to miss your dose. Call your doctor or health care professional if you are unable to keep an appointment. What may interact with this medicine? This medicine may interact with the following medications: -other iron products What should I watch for while using this medicine? Visit your doctor or healthcare professional regularly. Tell your doctor or healthcare professional if your symptoms do not start to get better or if they get worse. You may need blood work done while you are taking this medicine. You may need to follow a special diet. Talk to your doctor. Foods that contain iron include: whole grains/cereals, dried fruits, beans, or peas, leafy green vegetables, and organ meats (liver, kidney). What side effects may I notice from receiving this medicine? Side effects that you should report to your doctor or health care professional as soon as possible: -allergic reactions like skin rash, itching or hives, swelling of the  face, lips, or tongue -breathing problems -changes in blood pressure -feeling faint or lightheaded, falls -fever or chills -flushing, sweating, or hot feelings -swelling of the ankles or feet Side effects that usually do not require medical attention (report to your doctor or health care professional if they continue or are bothersome): -diarrhea -headache -nausea, vomiting -stomach pain Where should I keep my medicine? This drug is given in a hospital or clinic and will not be stored at home.  2017 Elsevier/Gold Standard (2015-08-10 12:41:49)  

## 2016-08-28 ENCOUNTER — Ambulatory Visit (HOSPITAL_BASED_OUTPATIENT_CLINIC_OR_DEPARTMENT_OTHER): Payer: BC Managed Care – PPO

## 2016-08-28 VITALS — BP 131/70 | HR 96 | Temp 98.2°F | Resp 18

## 2016-08-28 DIAGNOSIS — O99013 Anemia complicating pregnancy, third trimester: Secondary | ICD-10-CM

## 2016-08-28 DIAGNOSIS — D509 Iron deficiency anemia, unspecified: Secondary | ICD-10-CM

## 2016-08-28 MED ORDER — SODIUM CHLORIDE 0.9 % IV SOLN
Freq: Once | INTRAVENOUS | Status: AC
  Administered 2016-08-28: 09:00:00 via INTRAVENOUS

## 2016-08-28 MED ORDER — SODIUM CHLORIDE 0.9 % IV SOLN
510.0000 mg | Freq: Once | INTRAVENOUS | Status: AC
  Administered 2016-08-28: 510 mg via INTRAVENOUS
  Filled 2016-08-28: qty 17

## 2016-08-28 NOTE — Patient Instructions (Signed)
Ferumoxytol injection What is this medicine? FERUMOXYTOL is an iron complex. Iron is used to make healthy red blood cells, which carry oxygen and nutrients throughout the body. This medicine is used to treat iron deficiency anemia in people with chronic kidney disease. COMMON BRAND NAME(S): Feraheme What should I tell my health care provider before I take this medicine? They need to know if you have any of these conditions: -anemia not caused by low iron levels -high levels of iron in the blood -magnetic resonance imaging (MRI) test scheduled -an unusual or allergic reaction to iron, other medicines, foods, dyes, or preservatives -pregnant or trying to get pregnant -breast-feeding How should I use this medicine? This medicine is for injection into a vein. It is given by a health care professional in a hospital or clinic setting. Talk to your pediatrician regarding the use of this medicine in children. Special care may be needed. What if I miss a dose? It is important not to miss your dose. Call your doctor or health care professional if you are unable to keep an appointment. What may interact with this medicine? This medicine may interact with the following medications: -other iron products What should I watch for while using this medicine? Visit your doctor or healthcare professional regularly. Tell your doctor or healthcare professional if your symptoms do not start to get better or if they get worse. You may need blood work done while you are taking this medicine. You may need to follow a special diet. Talk to your doctor. Foods that contain iron include: whole grains/cereals, dried fruits, beans, or peas, leafy green vegetables, and organ meats (liver, kidney). What side effects may I notice from receiving this medicine? Side effects that you should report to your doctor or health care professional as soon as possible: -allergic reactions like skin rash, itching or hives, swelling of the  face, lips, or tongue -breathing problems -changes in blood pressure -feeling faint or lightheaded, falls -fever or chills -flushing, sweating, or hot feelings -swelling of the ankles or feet Side effects that usually do not require medical attention (report to your doctor or health care professional if they continue or are bothersome): -diarrhea -headache -nausea, vomiting -stomach pain Where should I keep my medicine? This drug is given in a hospital or clinic and will not be stored at home.  2017 Elsevier/Gold Standard (2015-08-10 12:41:49)  

## 2016-08-30 ENCOUNTER — Telehealth: Payer: Self-pay | Admitting: Oncology

## 2016-08-30 NOTE — Telephone Encounter (Signed)
[facility]

## 2016-09-19 ENCOUNTER — Telehealth: Payer: Self-pay | Admitting: Hematology

## 2016-09-19 NOTE — Telephone Encounter (Signed)
Moved 3/26 f/u with Dr. Janyth ContesZhou to 3/27 with Dr. Candise CheKale. Lab remains 3/21. Not able to reach patient by phone. Left message re date/time/provider and mailed schedule. Patient asked to call me re changes.

## 2016-09-20 LAB — OB RESULTS CONSOLE GBS: STREP GROUP B AG: NEGATIVE

## 2016-10-09 ENCOUNTER — Other Ambulatory Visit (HOSPITAL_BASED_OUTPATIENT_CLINIC_OR_DEPARTMENT_OTHER): Payer: BC Managed Care – PPO

## 2016-10-09 ENCOUNTER — Telehealth (HOSPITAL_COMMUNITY): Payer: Self-pay | Admitting: *Deleted

## 2016-10-09 ENCOUNTER — Encounter (HOSPITAL_COMMUNITY): Payer: Self-pay | Admitting: *Deleted

## 2016-10-09 DIAGNOSIS — D509 Iron deficiency anemia, unspecified: Secondary | ICD-10-CM

## 2016-10-09 DIAGNOSIS — D508 Other iron deficiency anemias: Secondary | ICD-10-CM

## 2016-10-09 LAB — IRON AND TIBC
%SAT: 33 % (ref 21–57)
Iron: 115 ug/dL (ref 41–142)
TIBC: 349 ug/dL (ref 236–444)
UIBC: 234 ug/dL (ref 120–384)

## 2016-10-09 LAB — CBC WITH DIFFERENTIAL/PLATELET
BASO%: 0.4 % (ref 0.0–2.0)
BASOS ABS: 0 10*3/uL (ref 0.0–0.1)
EOS%: 0.2 % (ref 0.0–7.0)
Eosinophils Absolute: 0 10*3/uL (ref 0.0–0.5)
HEMATOCRIT: 37.5 % (ref 34.8–46.6)
HEMOGLOBIN: 12.6 g/dL (ref 11.6–15.9)
LYMPH%: 16 % (ref 14.0–49.7)
MCH: 31.8 pg (ref 25.1–34.0)
MCHC: 33.6 g/dL (ref 31.5–36.0)
MCV: 94.6 fL (ref 79.5–101.0)
MONO#: 0.8 10*3/uL (ref 0.1–0.9)
MONO%: 11.6 % (ref 0.0–14.0)
NEUT%: 71.8 % (ref 38.4–76.8)
NEUTROS ABS: 4.9 10*3/uL (ref 1.5–6.5)
Platelets: 173 10*3/uL (ref 145–400)
RBC: 3.97 10*6/uL (ref 3.70–5.45)
RDW: 17.5 % — AB (ref 11.2–14.5)
WBC: 6.8 10*3/uL (ref 3.9–10.3)
lymph#: 1.1 10*3/uL (ref 0.9–3.3)

## 2016-10-09 LAB — FERRITIN: Ferritin: 59 ng/ml (ref 9–269)

## 2016-10-09 NOTE — Telephone Encounter (Signed)
Preadmission screen  

## 2016-10-14 ENCOUNTER — Encounter (HOSPITAL_COMMUNITY): Payer: Self-pay

## 2016-10-14 ENCOUNTER — Inpatient Hospital Stay (HOSPITAL_COMMUNITY): Payer: BC Managed Care – PPO | Admitting: Anesthesiology

## 2016-10-14 ENCOUNTER — Ambulatory Visit: Payer: BC Managed Care – PPO | Admitting: Hematology

## 2016-10-14 ENCOUNTER — Inpatient Hospital Stay (HOSPITAL_COMMUNITY)
Admission: RE | Admit: 2016-10-14 | Discharge: 2016-10-16 | DRG: 774 | Disposition: A | Discharge status: Home / Self Care | Payer: BC Managed Care – PPO | Source: Ambulatory Visit | Attending: Obstetrics and Gynecology | Admitting: Obstetrics and Gynecology

## 2016-10-14 DIAGNOSIS — Z3493 Encounter for supervision of normal pregnancy, unspecified, third trimester: Secondary | ICD-10-CM | POA: Diagnosis present

## 2016-10-14 DIAGNOSIS — Z3A39 39 weeks gestation of pregnancy: Secondary | ICD-10-CM | POA: Diagnosis not present

## 2016-10-14 DIAGNOSIS — Z87891 Personal history of nicotine dependence: Secondary | ICD-10-CM

## 2016-10-14 DIAGNOSIS — O9902 Anemia complicating childbirth: Principal | ICD-10-CM | POA: Diagnosis present

## 2016-10-14 DIAGNOSIS — D509 Iron deficiency anemia, unspecified: Secondary | ICD-10-CM | POA: Diagnosis present

## 2016-10-14 LAB — TYPE AND SCREEN
ABO/RH(D): B NEG
ANTIBODY SCREEN: NEGATIVE

## 2016-10-14 LAB — CBC
HCT: 38.4 % (ref 36.0–46.0)
HEMOGLOBIN: 13 g/dL (ref 12.0–15.0)
MCH: 32 pg (ref 26.0–34.0)
MCHC: 33.9 g/dL (ref 30.0–36.0)
MCV: 94.6 fL (ref 78.0–100.0)
PLATELETS: 189 10*3/uL (ref 150–400)
RBC: 4.06 MIL/uL (ref 3.87–5.11)
RDW: 15.8 % — ABNORMAL HIGH (ref 11.5–15.5)
WBC: 6.3 10*3/uL (ref 4.0–10.5)

## 2016-10-14 LAB — ABO/RH: ABO/RH(D): B NEG

## 2016-10-14 LAB — RPR: RPR: NONREACTIVE

## 2016-10-14 MED ORDER — LACTATED RINGERS IV SOLN: 500.0000 mL | Freq: Once | INTRAVENOUS | Status: DC

## 2016-10-14 MED ORDER — LACTATED RINGERS IV SOLN
INTRAVENOUS | Status: DC
  Administered 2016-10-14 (×3): via INTRAVENOUS

## 2016-10-14 MED ORDER — EPHEDRINE 5 MG/ML INJ
10.0000 mg | INTRAVENOUS | Status: DC | PRN
Start: 2016-10-14 — End: 2016-10-15
  Filled 2016-10-14: qty 2

## 2016-10-14 MED ORDER — ONDANSETRON HCL 4 MG/2ML IJ SOLN: 4.0000 mg | Freq: Four times a day (QID) | INTRAMUSCULAR | Status: DC | PRN

## 2016-10-14 MED ORDER — OXYCODONE-ACETAMINOPHEN 5-325 MG PO TABS: 2.0000 | ORAL_TABLET | ORAL | Status: DC | PRN

## 2016-10-14 MED ORDER — DIPHENHYDRAMINE HCL 50 MG/ML IJ SOLN: 12.5000 mg | INTRAMUSCULAR | Status: DC | PRN

## 2016-10-14 MED ORDER — OXYTOCIN BOLUS FROM INFUSION
500.0000 mL | Freq: Once | INTRAVENOUS | Status: AC
  Administered 2016-10-14: 500 mL via INTRAVENOUS

## 2016-10-14 MED ORDER — OXYTOCIN 40 UNITS IN LACTATED RINGERS INFUSION - SIMPLE MED: 2.5000 [IU]/h | INTRAVENOUS | Status: DC

## 2016-10-14 MED ORDER — LACTATED RINGERS IV SOLN
500.0000 mL | INTRAVENOUS | Status: DC | PRN
Start: 2016-10-14 — End: 2016-10-15

## 2016-10-14 MED ORDER — LIDOCAINE HCL (PF) 1 % IJ SOLN
INTRAMUSCULAR | Status: DC | PRN
  Administered 2016-10-14 (×2): 4 mL

## 2016-10-14 MED ORDER — EPHEDRINE 5 MG/ML INJ
10.0000 mg | INTRAVENOUS | Status: DC | PRN
  Filled 2016-10-14: qty 2

## 2016-10-14 MED ORDER — TERBUTALINE SULFATE 1 MG/ML IJ SOLN
0.2500 mg | Freq: Once | INTRAMUSCULAR | Status: DC | PRN
  Filled 2016-10-14: qty 1

## 2016-10-14 MED ORDER — FENTANYL 2.5 MCG/ML BUPIVACAINE 1/10 % EPIDURAL INFUSION (WH - ANES)
14.0000 mL/h | INTRAMUSCULAR | Status: DC | PRN
  Administered 2016-10-14: 14 mL/h via EPIDURAL
  Filled 2016-10-14: qty 100

## 2016-10-14 MED ORDER — SOD CITRATE-CITRIC ACID 500-334 MG/5ML PO SOLN: 30.0000 mL | ORAL | Status: DC | PRN

## 2016-10-14 MED ORDER — ACETAMINOPHEN 325 MG PO TABS: 650.0000 mg | ORAL_TABLET | ORAL | Status: DC | PRN

## 2016-10-14 MED ORDER — FENTANYL 2.5 MCG/ML BUPIVACAINE 1/10 % EPIDURAL INFUSION (WH - ANES): 14.0000 mL/h | INTRAMUSCULAR | Status: DC | PRN

## 2016-10-14 MED ORDER — LIDOCAINE HCL (PF) 1 % IJ SOLN
30.0000 mL | INTRAMUSCULAR | Status: DC | PRN
  Filled 2016-10-14: qty 30

## 2016-10-14 MED ORDER — BUTORPHANOL TARTRATE 1 MG/ML IJ SOLN: 1.0000 mg | INTRAMUSCULAR | Status: DC | PRN

## 2016-10-14 MED ORDER — PHENYLEPHRINE 40 MCG/ML (10ML) SYRINGE FOR IV PUSH (FOR BLOOD PRESSURE SUPPORT)
80.0000 ug | PREFILLED_SYRINGE | INTRAVENOUS | Status: DC | PRN
Start: 2016-10-14 — End: 2016-10-15
  Filled 2016-10-14: qty 5

## 2016-10-14 MED ORDER — OXYTOCIN 40 UNITS IN LACTATED RINGERS INFUSION - SIMPLE MED
1.0000 m[IU]/min | INTRAVENOUS | Status: DC
  Administered 2016-10-14: 2 m[IU]/min via INTRAVENOUS
  Administered 2016-10-14: 41.667 m[IU]/min via INTRAVENOUS
  Filled 2016-10-14: qty 1000

## 2016-10-14 MED ORDER — PHENYLEPHRINE 40 MCG/ML (10ML) SYRINGE FOR IV PUSH (FOR BLOOD PRESSURE SUPPORT)
80.0000 ug | PREFILLED_SYRINGE | INTRAVENOUS | Status: DC | PRN
  Filled 2016-10-14: qty 5
  Filled 2016-10-14: qty 10

## 2016-10-14 MED ORDER — OXYCODONE-ACETAMINOPHEN 5-325 MG PO TABS: 1.0000 | ORAL_TABLET | ORAL | Status: DC | PRN

## 2016-10-14 NOTE — Anesthesia Procedure Notes (Signed)
Epidural Patient location during procedure: OB Start time: 10/14/2016 1:37 PM End time: 10/14/2016 1:50 PM  Staffing Anesthesiologist: Lewie LoronGERMEROTH, Ranyah Groeneveld Performed: anesthesiologist   Preanesthetic Checklist Completed: patient identified, pre-op evaluation, timeout performed, IV checked, risks and benefits discussed and monitors and equipment checked  Epidural Patient position: sitting Prep: site prepped and draped and DuraPrep Patient monitoring: heart rate, continuous pulse ox and blood pressure Approach: midline Location: L3-L4 Injection technique: LOR air and LOR saline  Needle:  Needle type: Tuohy  Needle gauge: 17 G Needle length: 9 cm Needle insertion depth: 7 cm Catheter type: closed end flexible Catheter size: 19 Gauge Catheter at skin depth: 13 cm Test dose: negative  Assessment Sensory level: T8 Events: blood not aspirated, injection not painful, no injection resistance, negative IV test and no paresthesia  Additional Notes Reason for block:procedure for pain

## 2016-10-14 NOTE — Progress Notes (Signed)
Feeling some ctx Afeb, VSS FHT- 140, Cat I, ctx q 2-4 min VE-3/50/-2, vtx, AROM clear Continue pitocin and monitor progress

## 2016-10-14 NOTE — Anesthesia Pain Management Evaluation Note (Signed)
  CRNA Pain Management Visit Note  Patient: Susan Everett, 26 y.o., female  "Hello I am a member of the anesthesia team at [facility]. We have an anesthesia team available at all times to provide care throughout the hospital, including epidural management and anesthesia for C-section. I don't know your plan for the delivery whether it a natural birth, water birth, IV sedation, nitrous supplementation, doula or epidural, but we want to meet your pain goals."   1.Was your pain managed to your expectations on prior hospitalizations?   Yes   2.What is your expectation for pain management during this hospitalization?     Epidural  3.How can we help you reach that goal? epidural  Record the patient's initial score and the patient's pain goal.   Pain: 0  Pain Goal: 5 [facility] wants you to be able to say your pain was always managed very well.  Susan Everett 10/14/2016

## 2016-10-14 NOTE — H&P (Signed)
Susan Everett is a 26 y.o. female, G2 28P0101, EGA 39+ weeks [facility] presenting for elective induction.  Prenatal course complicated by iron deficiency anemia requiring IV iron, no other problems.  OB History    Gravida Para Term Preterm AB Living   2 1 0 1 0 1   SAB TAB Ectopic Multiple Live Births   0 0 0 0 1     Past Medical History:  Diagnosis Date  . Anemia   . Hx of chlamydia infection    History reviewed. No pertinent surgical history. Family History: family history is not on file. Social History:  reports that she quit smoking about 6 years ago. She has quit using smokeless tobacco. She reports that she does not drink alcohol or use drugs.     Maternal Diabetes: No Genetic Screening: Normal Maternal Ultrasounds/Referrals: Normal Fetal Ultrasounds or other Referrals:  None Maternal Substance Abuse:  No Significant Maternal Medications:  None Significant Maternal Lab Results:  Lab values include: Group B Strep negative Other Comments:  None  Review of Systems  Respiratory: Negative.   Cardiovascular: Negative.    Maternal Medical History:  Contractions: Frequency: irregular.   Perceived severity is mild.    Fetal activity: Perceived fetal activity is normal.      Dilation: 1.5 Effacement (%): Thick Station: -2 Exam by:: J.Cox, RN Blood pressure 132/68, pulse 84, temperature 97.4 F (36.3 C), temperature source Oral, resp. rate 18, height 5\' 4"  (1.626 m), weight 101.2 kg (223 lb). Maternal Exam:  Uterine Assessment: Contraction strength is mild.  Contraction frequency is irregular.   Abdomen: Estimated fetal weight is 7 1/2 lbs.   Fetal presentation: vertex  Introitus: Normal vulva. Normal vagina.  Amniotic fluid character: not assessed.  Pelvis: adequate for delivery.   Cervix: Cervix evaluated by digital exam.     Fetal Exam Fetal Monitor Review: Mode: ultrasound.   Baseline rate: 130.  Variability: moderate (6-25 bpm).   Pattern:  accelerations present and no decelerations.    Fetal State Assessment: Category I - tracings are normal.     Physical Exam  Vitals reviewed. Constitutional: She appears well-developed and well-nourished.  Cardiovascular: Normal rate and regular rhythm.   Respiratory: Effort normal. No respiratory distress.  GI: Soft.    Prenatal labs: ABO, Rh: B/Negative/-- (09/14 0000) Antibody: Negative (09/14 0000) Rubella: Immune (09/14 0000) RPR: Nonreactive (09/14 0000)  HBsAg: Negative (09/14 0000)  HIV: Non-reactive (09/14 0000)  GBS: Negative (03/02 0000)   Assessment/Plan: IUP at 39+ weeks for elective induction.  On pitocin, will monitor progress, AROM when more dilated.  Risks of pitocin have been discussed.   Susan Everett 10/14/2016, 9:11 AM

## 2016-10-14 NOTE — Anesthesia Preprocedure Evaluation (Signed)
Anesthesia Evaluation  Patient identified by MRN, date of birth, ID band Patient awake    Reviewed: Allergy & Precautions, NPO status , Patient's Chart, lab work & pertinent test results  Airway Mallampati: II  TM Distance: >3 FB Neck ROM: Full    Dental no notable dental hx.    Pulmonary neg pulmonary ROS, former smoker,    Pulmonary exam normal breath sounds clear to auscultation       Cardiovascular negative cardio ROS Normal cardiovascular exam Rhythm:Regular Rate:Normal     Neuro/Psych negative neurological ROS  negative psych ROS   GI/Hepatic negative GI ROS, Neg liver ROS,   Endo/Other  negative endocrine ROS  Renal/GU negative Renal ROS     Musculoskeletal negative musculoskeletal ROS (+)   Abdominal (+) + obese,   Peds  Hematology  (+) anemia ,   Anesthesia Other Findings   Reproductive/Obstetrics (+) Pregnancy                             Anesthesia Physical Anesthesia Plan  ASA: II  Anesthesia Plan: Epidural   Post-op Pain Management:    Induction:   Airway Management Planned:   Additional Equipment:   Intra-op Plan:   Post-operative Plan:   Informed Consent: I have reviewed the patients History and Physical, chart, labs and discussed the procedure including the risks, benefits and alternatives for the proposed anesthesia with the patient or authorized representative who has indicated his/her understanding and acceptance.     Plan Discussed with:   Anesthesia Plan Comments:         Anesthesia Quick Evaluation

## 2016-10-14 NOTE — Progress Notes (Signed)
Pt already stated during her admission that she wanted some of that IV stuff BC it makes you feel high. At this time pt req IV pain med to just sleep. Advised pt that meds were for pain not sleep. She said OK I will be in pain then. Also said you don't want to give that med to your baby unless you need it she said yes I do.

## 2016-10-14 NOTE — Progress Notes (Addendum)
1830 Md at bs  1840: room set up for delivery.  1841: initiate pushing with next contraction.   1943: Pushing started  1949: delivery of viable baby girl. MD bulb suctioned at perineum. Cord clamp delayed.  APGAR 9/9  1951: infant to skin to skin. Warm blanket placed on infatn .  1953: bolus pitocin started per MD  1954 Placenta  2003: pericare and explaination done. Fundal massaged and blood shooting out. MD notified prior to leaving room.   2017: exploretation of laceration cont with MD at bs  2022: PP care initiated.   2150: epidural cath removed with blue tip intact and noted.  2200: up to bathrroom and voided without problems. PP nurse notified of pt coming over.   2220: pt left birthing unit via wheelchair.  2235: report given to RN Dava

## 2016-10-15 ENCOUNTER — Inpatient Hospital Stay (HOSPITAL_COMMUNITY)
Admission: AD | Admit: 2016-10-15 | Payer: BC Managed Care – PPO | Source: Ambulatory Visit | Admitting: Obstetrics and Gynecology

## 2016-10-15 ENCOUNTER — Ambulatory Visit: Payer: BC Managed Care – PPO | Admitting: Hematology

## 2016-10-15 LAB — CBC
HEMATOCRIT: 32.3 % — AB (ref 36.0–46.0)
HEMOGLOBIN: 11.2 g/dL — AB (ref 12.0–15.0)
MCH: 32.7 pg (ref 26.0–34.0)
MCHC: 34.7 g/dL (ref 30.0–36.0)
MCV: 94.4 fL (ref 78.0–100.0)
Platelets: 157 10*3/uL (ref 150–400)
RBC: 3.42 MIL/uL — ABNORMAL LOW (ref 3.87–5.11)
RDW: 15.8 % — ABNORMAL HIGH (ref 11.5–15.5)
WBC: 11.1 10*3/uL — ABNORMAL HIGH (ref 4.0–10.5)

## 2016-10-15 LAB — KLEIHAUER-BETKE STAIN
# Vials RhIg: 1
FETAL CELLS %: 0 %
QUANTITATION FETAL HEMOGLOBIN: 0 mL

## 2016-10-15 MED ORDER — PRENATAL MULTIVITAMIN CH
1.0000 | ORAL_TABLET | Freq: Every day | ORAL | Status: DC
  Administered 2016-10-15 – 2016-10-16 (×2): 1 via ORAL
  Filled 2016-10-15 (×2): qty 1

## 2016-10-15 MED ORDER — OXYCODONE HCL 5 MG PO TABS: 5.0000 mg | ORAL_TABLET | ORAL | Status: DC | PRN

## 2016-10-15 MED ORDER — BENZOCAINE-MENTHOL 20-0.5 % EX AERO: 1.0000 "application " | INHALATION_SPRAY | CUTANEOUS | Status: DC | PRN

## 2016-10-15 MED ORDER — ZOLPIDEM TARTRATE 5 MG PO TABS: 5.0000 mg | ORAL_TABLET | Freq: Every evening | ORAL | Status: DC | PRN

## 2016-10-15 MED ORDER — IBUPROFEN 600 MG PO TABS
600.0000 mg | ORAL_TABLET | Freq: Four times a day (QID) | ORAL | Status: DC
  Administered 2016-10-15 – 2016-10-16 (×7): 600 mg via ORAL
  Filled 2016-10-15 (×7): qty 1

## 2016-10-15 MED ORDER — DIPHENHYDRAMINE HCL 25 MG PO CAPS: 25.0000 mg | ORAL_CAPSULE | Freq: Four times a day (QID) | ORAL | Status: DC | PRN

## 2016-10-15 MED ORDER — ACETAMINOPHEN 325 MG PO TABS: 650.0000 mg | ORAL_TABLET | ORAL | Status: DC | PRN

## 2016-10-15 MED ORDER — SIMETHICONE 80 MG PO CHEW: 80.0000 mg | CHEWABLE_TABLET | ORAL | Status: DC | PRN

## 2016-10-15 MED ORDER — RHO D IMMUNE GLOBULIN 1500 UNIT/2ML IJ SOSY
300.0000 ug | PREFILLED_SYRINGE | Freq: Once | INTRAMUSCULAR | Status: AC
  Administered 2016-10-15: 300 ug via INTRAMUSCULAR
  Filled 2016-10-15: qty 2

## 2016-10-15 MED ORDER — MEASLES, MUMPS & RUBELLA VAC ~~LOC~~ INJ
0.5000 mL | INJECTION | Freq: Once | SUBCUTANEOUS | Status: DC
  Filled 2016-10-15: qty 0.5

## 2016-10-15 MED ORDER — METHYLERGONOVINE MALEATE 0.2 MG PO TABS: 0.2000 mg | ORAL_TABLET | ORAL | Status: DC | PRN

## 2016-10-15 MED ORDER — WITCH HAZEL-GLYCERIN EX PADS: 1.0000 "application " | MEDICATED_PAD | CUTANEOUS | Status: DC | PRN

## 2016-10-15 MED ORDER — ONDANSETRON HCL 4 MG PO TABS: 4.0000 mg | ORAL_TABLET | ORAL | Status: DC | PRN

## 2016-10-15 MED ORDER — MAGNESIUM HYDROXIDE 400 MG/5ML PO SUSP: 30.0000 mL | ORAL | Status: DC | PRN

## 2016-10-15 MED ORDER — SENNOSIDES-DOCUSATE SODIUM 8.6-50 MG PO TABS
2.0000 | ORAL_TABLET | ORAL | Status: DC
  Administered 2016-10-15: 2 via ORAL
  Filled 2016-10-15 (×2): qty 2

## 2016-10-15 MED ORDER — COCONUT OIL OIL: 1.0000 "application " | TOPICAL_OIL | Status: DC | PRN

## 2016-10-15 MED ORDER — OXYCODONE HCL 5 MG PO TABS
10.0000 mg | ORAL_TABLET | ORAL | Status: DC | PRN
Start: 2016-10-15 — End: 2016-10-16

## 2016-10-15 MED ORDER — TETANUS-DIPHTH-ACELL PERTUSSIS 5-2.5-18.5 LF-MCG/0.5 IM SUSP: 0.5000 mL | Freq: Once | INTRAMUSCULAR | Status: DC

## 2016-10-15 MED ORDER — METHYLERGONOVINE MALEATE 0.2 MG/ML IJ SOLN: 0.2000 mg | INTRAMUSCULAR | Status: DC | PRN

## 2016-10-15 MED ORDER — ONDANSETRON HCL 4 MG/2ML IJ SOLN: 4.0000 mg | INTRAMUSCULAR | Status: DC | PRN

## 2016-10-15 MED ORDER — DIBUCAINE 1 % RE OINT: 1.0000 "application " | TOPICAL_OINTMENT | RECTAL | Status: DC | PRN

## 2016-10-15 NOTE — Plan of Care (Signed)
Problem: Education: Goal: Knowledge of condition will improve Patient has small blister to right nipple. Encouraged patient to be sure that baby has a good deep latch when breast feeding. Encouraged patient to call when latching baby in order to assess.

## 2016-10-15 NOTE — Anesthesia Postprocedure Evaluation (Signed)
Anesthesia Post Note  Patient: Susan Everett  Procedure(s) Performed: * No procedures listed *  Patient location during evaluation: Mother Baby Anesthesia Type: Epidural Level of consciousness: awake and alert Pain management: pain level controlled Vital Signs Assessment: post-procedure vital signs reviewed and stable Respiratory status: spontaneous breathing, nonlabored ventilation and respiratory function stable Cardiovascular status: stable Postop Assessment: no headache, no backache and epidural receding Anesthetic complications: no        Last Vitals:  Vitals:   10/14/16 2340 10/15/16 0300  BP: 110/65 119/70  Pulse: 99 88  Resp: 18   Temp: 36.9 C 36.9 C    Last Pain:  Vitals:   10/15/16 0505  TempSrc:   PainSc: Asleep   Pain Goal:                 Junious SilkGILBERT,Rashana Andrew

## 2016-10-15 NOTE — Progress Notes (Signed)
Patient ID: Susan [place], female   DOB: [birth year], 26 y.o.   MRN: [id number] Pt doing well on PPD3! Reports pain controlled, lochia mild, denies fever,chills, CP or lightheadedness/headaches. She is bonding well with baby- breastfeeding. She has ambulated with no issues and voided well post delivery VSS ABD- firm, 3cm below umbil EXT - +1 edema, no homans  CBC: 11.1>11.2<157  A/P: PPD#1 - stable        Routine pp care        Likely discharge to home tomorrow

## 2016-10-15 NOTE — Lactation Note (Signed)
This note was copied from a baby's chart. Lactation Consultation Note Mom's 2nd child, didn't BF her first child. Mom has BF well since delivery. Mom plans to BF after discharge home. Had slight pain occasionally. Mom very sleepy, everyone in rm. Sleeping. Lab into draw blood.  Mom encouraged to feed baby 8-12 times/24 hours and with feeding cues. Mom encouraged to waken baby for feeds. Encouraged to call for assistance or questions, and to call when more alert for more education on BF.  [facility]/LC brochure given w/resources, support groups and LC services. Mom has WIC. Patient Name: Susan Everett Today's Date: 10/15/2016 Reason for consult: Initial assessment   Maternal Data Has patient been taught Hand Expression?: Yes Does the patient have breastfeeding experience prior to this delivery?: No  Feeding Feeding Type: Breast Fed Length of feed: 60 min  LATCH Score/Interventions                      Lactation Tools Discussed/Used WIC Program: Yes   Consult Status Consult Status: Follow-up Date: 10/15/16 (in pm) Follow-up type: In-patient    Charyl DancerCARVER, Marquay Kruse G 10/15/2016, 5:47 AM

## 2016-10-16 LAB — RH IG WORKUP (INCLUDES ABO/RH)
ABO/RH(D): B NEG
Gestational Age(Wks): 40
UNIT DIVISION: 0

## 2016-10-16 MED ORDER — PRENATAL MULTIVITAMIN CH: 1.0000 | ORAL_TABLET | Freq: Every day | ORAL | 3 refills | Status: DC

## 2016-10-16 MED ORDER — IBUPROFEN 800 MG PO TABS: 800.0000 mg | ORAL_TABLET | Freq: Three times a day (TID) | ORAL | 1 refills | Status: DC | PRN

## 2016-10-16 NOTE — Lactation Note (Signed)
This note was copied from a baby's chart. Lactation Consultation Note  Patient Name: Susan Everett Today's Date: 10/16/2016 Reason for consult: Follow-up assessment  With this mom and term baby, now 6237 hours old. Mom is breast and formula feeding, and was taught how using formula will decrease her milk supply. Mom said she was not planning to exclusively breast feed, although her baby seems to love breast feeding. I gave mom an manual hand pump, and instructed in it's use and care. Breast care/engorgement care reviewed with mom. Mom knows to call for questions/concerns.   Maternal Data    Feeding Feeding Type: Bottle Fed - Formula  LATCH Score/Interventions                      Lactation Tools Discussed/Used     Consult Status Consult Status: Complete Follow-up type: Call as needed    Susan Everett, Susan Everett 10/16/2016, 9:34 AM

## 2016-10-16 NOTE — Progress Notes (Signed)
Post Partum Day 2 Subjective: no complaints, up ad lib, voiding, tolerating PO and nl lochia, pain controlled  Objective: Blood pressure 133/75, pulse 81, temperature 98.1 F (36.7 C), temperature source Oral, resp. rate 18, height 5\' 4"  (1.626 m), weight 101.2 kg (223 lb), SpO2 100 %.  Physical Exam:  General: alert and no distress Lochia: appropriate Uterine Fundus: firm   Recent Labs  10/14/16 0828 10/15/16 0542  HGB 13.0 11.2*  HCT 38.4 32.3*    Assessment/Plan: Discharge home.  Routine care  D/c with motrin and NV, f/u 6 weeks   LOS: 2 days   Bovard-Stuckert, Susan Everett 10/16/2016, 8:46 AM

## 2016-10-16 NOTE — Discharge Summary (Signed)
OB Discharge Summary     Patient Name: Susan Everett DOB: [birth year] MRN: [id number]  Date of admission: 10/14/2016 Delivering MD: Jackelyn Knife, TODD   Date of discharge: 10/16/2016  Admitting diagnosis: INDUCTION Intrauterine pregnancy: [redacted]w[redacted]d     Secondary diagnosis:  Active Problems:   Normal pregnancy in third trimester   SVD (spontaneous vaginal delivery)  Additional problems: N/A     Discharge diagnosis: Term Pregnancy Delivered                                                                                                Post partum procedures:N/A  Augmentation: AROM and Pitocin  Complications: None  Hospital course:  Induction of Labor With Vaginal Delivery   26 y.o. yo G2P0101 at [redacted]w[redacted]d was admitted to the hospital 10/14/2016 for induction of labor.  Indication for induction: Favorable cervix at term.  Patient had an uncomplicated labor course as follows: Membrane Rupture Time/Date: 12:06 PM ,10/14/2016   Intrapartum Procedures: Episiotomy: None [1]                                         Lacerations:  None [1]  Patient had delivery of a Viable infant.  Information for the patient's newborn:  [place], Girl Edith [[id number]]  Delivery Method: Vaginal, Spontaneous Delivery (Filed from Delivery Summary)   10/14/2016  Details of delivery can be found in separate delivery note.  Patient had a routine postpartum course. Patient is discharged home 10/16/16.  Physical exam  Vitals:   10/15/16 0300 10/15/16 0950 10/15/16 1747 10/16/16 0552  BP: 119/70 112/71 125/66 133/75  Pulse: 88 89 87 81  Resp:  18 18 18   Temp: 98.5 F (36.9 C) 97.9 F (36.6 C) 98.2 F (36.8 C) 98.1 F (36.7 C)  TempSrc:  Oral Oral Oral  SpO2: 100% 100%    Weight:      Height:       General: alert and no distress Lochia: appropriate Uterine Fundus: firm  Labs: Lab Results  Component Value Date   WBC 11.1 (H) 10/15/2016   HGB 11.2 (L) 10/15/2016   HCT 32.3 (L) 10/15/2016   MCV 94.4  10/15/2016   PLT 157 10/15/2016   CMP Latest Ref Rng & Units 08/05/2016  Glucose 70 - 140 mg/dl 99  BUN 7.0 - 08.6 mg/dL 6.1(L)  Creatinine 0.6 - 1.1 mg/dL 0.7  Sodium 578 - 469 mEq/L 138  Potassium 3.5 - 5.1 mEq/L 3.8  Chloride 101 - 111 mmol/L -  CO2 22 - 29 mEq/L 23  Calcium 8.4 - 10.4 mg/dL 8.8  Total Protein 6.4 - 8.3 g/dL 6.5  Total Bilirubin 6.29 - 1.20 mg/dL 5.28  Alkaline Phos 40 - 150 U/L 148  AST 5 - 34 U/L 15  ALT 0 - 55 U/L 20    Discharge instruction: per After Visit Summary and "Baby and Me Booklet".  After visit meds:  Allergies as of 10/16/2016   No Known Allergies     Medication List  TAKE these medications   ibuprofen 800 MG tablet Commonly known as:  ADVIL,MOTRIN Take 1 tablet (800 mg total) by mouth every 8 (eight) hours as needed.   prenatal multivitamin Tabs tablet Take 1 tablet by mouth daily at 12 noon.       Diet: routine diet  Activity: Advance as tolerated. Pelvic rest for 6 weeks.   Outpatient follow up:6 weeks Follow up Appt:No future appointments. Follow up Visit:No Follow-up on file.  Postpartum contraception: Undecided  Newborn Data: Live born female  Birth Weight: 9 lb 3.6 oz (4185 g) APGAR: 9, 9  Baby Feeding: Breast Disposition:home with mother   10/16/2016 Sherian ReinBovard-Stuckert, Ivannah Zody, MD

## 2016-10-17 ENCOUNTER — Ambulatory Visit: Payer: Self-pay

## 2016-10-17 NOTE — Lactation Note (Signed)
This note was copied from a baby's chart. Lactation Consultation Note  Patient Name: Girl Teriann [place] Today's Date: 10/17/2016 Reason for consult: Follow-up assessment;Breast/nipple pain;Difficult latch;Hyperbilirubinemia Mom has been giving mostly bottles. Her nipples are sore bilateral with positional stripes, breasts are full with nodules present, slight engorgement. Unsure of Mom's plan for BF at this time. At this time Mom just wants breast to empty to feel better, not getting much with hand pump. Mom did agree to try baby at breast, LC assisted with positioning and latch but baby not obtaining good depth, Mom's nipples are flat and not very compressible, baby is noted to have short lingual frenulum with heart shape to tongue. It was painful for Mom with baby at breast so started 24 nipple shield. Mom reported much less discomfort and willing to keep baby at breast. Left breast softened well but some nodules present outer quadrant. Right breast softened slightly, still some nodules present. Set up DEBP for Mom to post pump. Advised to give baby back any amount of EBM received with pumping. Advised to apply ice packs after BF/pumping. Engorgement care reviewed. Supplemental guidelines reviewed with Mom if BF and supplementing v/s pump/bottle feeding. Mom is getting DEBP from insurance company in few days. Discussed 2 week rental program with her if d/c today. Mom will advise. Encouraged massage with BF/pumping and stressed importance of BF or pumping every 2-3 hours or as needed when breasts are filling. Care for sore nipples reviewed with Mom, apply EBM, comfort gels given with instructions. Mom to call for questions/concerns. Encouraged OP f/u if decides to continue to work with BF and use nipple shield.   Maternal Data    Feeding Feeding Type: Breast Fed Length of feed: 20 min  LATCH Score/Interventions Latch: Grasps breast easily, tongue down, lips flanged, rhythmical sucking. (using 24  nipple shield) Intervention(s): Assist with latch  Audible Swallowing: A few with stimulation  Type of Nipple: Flat Intervention(s): Hand pump  Comfort (Breast/Nipple): Engorged, cracked, bleeding, large blisters, severe discomfort Problem noted: Engorgment Intervention(s): Ice Intervention(s): Expressed breast milk to nipple  Problem noted: Mild/Moderate discomfort Interventions (Mild/moderate discomfort): Pre-pump if needed;Post-pump;Comfort gels  Hold (Positioning): Assistance needed to correctly position infant at breast and maintain latch.  LATCH Score: 5  Lactation Tools Discussed/Used Tools: Pump;Nipple Shields;Comfort gels Nipple shield size: 24 Breast pump type: Double-Electric Breast Pump Pump Review: Setup, frequency, and cleaning;Milk Storage Initiated by:: KG Date initiated:: 10/17/16   Consult Status Consult Status: Follow-up Date: 10/17/16 Follow-up type: In-patient    Alfred LevinsGranger, Ivon Oelkers Ann 10/17/2016, 10:21 AM

## 2017-05-26 ENCOUNTER — Encounter (HOSPITAL_COMMUNITY): Payer: Self-pay

## 2017-06-25 LAB — OB RESULTS CONSOLE RUBELLA ANTIBODY, IGM: Rubella: IMMUNE

## 2017-06-25 LAB — OB RESULTS CONSOLE HIV ANTIBODY (ROUTINE TESTING): HIV: NONREACTIVE

## 2017-06-25 LAB — OB RESULTS CONSOLE RPR: RPR: NONREACTIVE

## 2017-06-25 LAB — OB RESULTS CONSOLE GC/CHLAMYDIA
Chlamydia: NEGATIVE
Gonorrhea: NEGATIVE

## 2017-06-25 LAB — OB RESULTS CONSOLE ABO/RH: RH TYPE: NEGATIVE

## 2017-06-25 LAB — OB RESULTS CONSOLE ANTIBODY SCREEN: Antibody Screen: NEGATIVE

## 2017-06-25 LAB — OB RESULTS CONSOLE HEPATITIS B SURFACE ANTIGEN: Hepatitis B Surface Ag: NEGATIVE

## 2017-07-01 ENCOUNTER — Inpatient Hospital Stay (HOSPITAL_COMMUNITY)
Admission: AD | Admit: 2017-07-01 | Discharge: 2017-07-01 | Disposition: A | Discharge status: Home / Self Care | Payer: BC Managed Care – PPO | Source: Ambulatory Visit | Attending: Obstetrics and Gynecology | Admitting: Obstetrics and Gynecology

## 2017-07-01 ENCOUNTER — Encounter (HOSPITAL_COMMUNITY): Payer: Self-pay | Admitting: *Deleted

## 2017-07-01 DIAGNOSIS — A084 Viral intestinal infection, unspecified: Secondary | ICD-10-CM | POA: Diagnosis not present

## 2017-07-01 DIAGNOSIS — O210 Mild hyperemesis gravidarum: Secondary | ICD-10-CM | POA: Diagnosis present

## 2017-07-01 DIAGNOSIS — Z87891 Personal history of nicotine dependence: Secondary | ICD-10-CM | POA: Insufficient documentation

## 2017-07-01 DIAGNOSIS — O9989 Other specified diseases and conditions complicating pregnancy, childbirth and the puerperium: Secondary | ICD-10-CM

## 2017-07-01 DIAGNOSIS — O99612 Diseases of the digestive system complicating pregnancy, second trimester: Secondary | ICD-10-CM | POA: Diagnosis not present

## 2017-07-01 DIAGNOSIS — Z3A14 14 weeks gestation of pregnancy: Secondary | ICD-10-CM | POA: Diagnosis not present

## 2017-07-01 LAB — POCT PREGNANCY, URINE: PREG TEST UR: POSITIVE — AB

## 2017-07-01 LAB — URINALYSIS, ROUTINE W REFLEX MICROSCOPIC
BACTERIA UA: NONE SEEN
BILIRUBIN URINE: NEGATIVE
Glucose, UA: NEGATIVE mg/dL
Hgb urine dipstick: NEGATIVE
Ketones, ur: 20 mg/dL — AB
Leukocytes, UA: NEGATIVE
Nitrite: NEGATIVE
PROTEIN: 30 mg/dL — AB
SPECIFIC GRAVITY, URINE: 1.029 (ref 1.005–1.030)
pH: 5 (ref 5.0–8.0)

## 2017-07-01 MED ORDER — ONDANSETRON 8 MG PO TBDP: 8.0000 mg | ORAL_TABLET | Freq: Three times a day (TID) | ORAL | 1 refills | Status: DC | PRN

## 2017-07-01 NOTE — Discharge Instructions (Signed)
Safe Medications in Pregnancy   Acne: Benzoyl Peroxide Salicylic Acid  Backache/Headache: Tylenol: 2 regular strength every 4 hours OR              2 Extra strength every 6 hours  Colds/Coughs/Allergies: Benadryl (alcohol free) 25 mg every 6 hours as needed Breath right strips Claritin Cepacol throat lozenges Chloraseptic throat spray Cold-Eeze- up to three times per day Cough drops, alcohol free Flonase (by prescription only) Guaifenesin Mucinex Robitussin DM (plain only, alcohol free) Saline nasal spray/drops Sudafed (pseudoephedrine) & Actifed ** use only after [redacted] weeks gestation and if you do not have high blood pressure Tylenol Vicks Vaporub Zinc lozenges Zyrtec   Constipation: Colace Ducolax suppositories Fleet enema Glycerin suppositories Metamucil Milk of magnesia Miralax Senokot Smooth move tea  Diarrhea: Kaopectate Imodium A-D  *NO pepto Bismol  Hemorrhoids: Anusol Anusol HC Preparation H Tucks  Indigestion: Tums Maalox Mylanta Zantac  Pepcid  Insomnia: Benadryl (alcohol free) 25mg  every 6 hours as needed Tylenol PM Unisom, no Gelcaps  Leg Cramps: Tums MagGel  Nausea/Vomiting:  Bonine Dramamine Emetrol Ginger extract Sea bands Meclizine  Nausea medication to take during pregnancy:  Unisom (doxylamine succinate 25 mg tablets) Take one tablet daily at bedtime. If symptoms are not adequately controlled, the dose can be increased to a maximum recommended dose of two tablets daily (1/2 tablet in the morning, 1/2 tablet mid-afternoon and one at bedtime). Vitamin B6 100mg  tablets. Take one tablet twice a day (up to 200 mg per day).  Skin Rashes: Aveeno products Benadryl cream or 25mg  every 6 hours as needed Calamine Lotion 1% cortisone cream  Yeast infection: Gyne-lotrimin 7 Monistat 7   **If taking multiple medications, please check labels to avoid duplicating the same active ingredients **take medication as directed on  the label ** Do not exceed 4000 mg of tylenol in 24 hours **Do not take medications that contain aspirin or ibuprofen    Food Choices to Help Relieve Diarrhea, Adult When you have diarrhea, the foods you eat and your eating habits are very important. Choosing the right foods and drinks can help:  Relieve diarrhea.  Replace lost fluids and nutrients.  Prevent dehydration.  What general guidelines should I follow? Relieving diarrhea  Choose foods with less than 2 g or .07 oz. of fiber per serving.  Limit fats to less than 8 tsp (38 g or 1.34 oz.) a day.  Avoid the following: ? Foods and beverages sweetened with high-fructose corn syrup, honey, or sugar alcohols such as xylitol, sorbitol, and mannitol. ? Foods that contain a lot of fat or sugar. ? Fried, greasy, or spicy foods. ? High-fiber grains, breads, and cereals. ? Raw fruits and vegetables.  Eat foods that are rich in probiotics. These foods include dairy products such as yogurt and fermented milk products. They help increase healthy bacteria in the stomach and intestines (gastrointestinal tract, or GI tract).  If you have lactose intolerance, avoid dairy products. These may make your diarrhea worse.  Take medicine to help stop diarrhea (antidiarrheal medicine) only as told by your health care provider. Replacing nutrients  Eat small meals or snacks every 3-4 hours.  Eat bland foods, such as white rice, toast, or baked potato, until your diarrhea starts to get better. Gradually reintroduce nutrient-rich foods as tolerated or as told by your health care provider. This includes: ? Well-cooked protein foods. ? Peeled, seeded, and soft-cooked fruits and vegetables. ? Low-fat dairy products.  Take vitamin and mineral supplements as told by  your health care provider. Preventing dehydration   Start by sipping water or a special solution to prevent dehydration (oral rehydration solution, ORS). Urine that is clear or pale  yellow means that you are getting enough fluid.  Try to drink at least 8-10 cups of fluid each day to help replace lost fluids.  You may add other liquids in addition to water, such as clear juice or decaffeinated sports drinks, as tolerated or as told by your health care provider.  Avoid drinks with caffeine, such as coffee, tea, or soft drinks.  Avoid alcohol. What foods are recommended? The items listed may not be a complete list. Talk with your health care provider about what dietary choices are best for you. Grains White rice. White, JamaicaFrench, or pita breads (fresh or toasted), including plain rolls, buns, or bagels. White pasta. Saltine, soda, or graham crackers. Pretzels. Low-fiber cereal. Cooked cereals made with water (such as cornmeal, farina, or cream cereals). Plain muffins. Matzo. Melba toast. Zwieback. Vegetables Potatoes (without the skin). Most well-cooked and canned vegetables without skins or seeds. Tender lettuce. Fruits Apple sauce. Fruits canned in juice. Cooked apricots, cherries, grapefruit, peaches, pears, or plums. Fresh bananas and cantaloupe. Meats and other protein foods Baked or boiled chicken. Eggs. Tofu. Fish. Seafood. Smooth nut butters. Ground or well-cooked tender beef, ham, veal, lamb, pork, or poultry. Dairy Plain yogurt, kefir, and unsweetened liquid yogurt. Lactose-free milk, buttermilk, skim milk, or soy milk. Low-fat or nonfat hard cheese. Beverages Water. Low-calorie sports drinks. Fruit juices without pulp. Strained tomato and vegetable juices. Decaffeinated teas. Sugar-free beverages not sweetened with sugar alcohols. Oral rehydration solutions, if approved by your health care provider. Seasoning and other foods Bouillon, broth, or soups made from recommended foods. What foods are not recommended? The items listed may not be a complete list. Talk with your health care provider about what dietary choices are best for you. Grains Whole grain, whole  wheat, bran, or rye breads, rolls, pastas, and crackers. Wild or brown rice. Whole grain or bran cereals. Barley. Oats and oatmeal. Corn tortillas or taco shells. Granola. Popcorn. Vegetables Raw vegetables. Fried vegetables. Cabbage, broccoli, Brussels sprouts, artichokes, baked beans, beet greens, corn, kale, legumes, peas, sweet potatoes, and yams. Potato skins. Cooked spinach and cabbage. Fruits Dried fruit, including raisins and dates. Raw fruits. Stewed or dried prunes. Canned fruits with syrup. Meat and other protein foods Fried or fatty meats. Deli meats. Chunky nut butters. Nuts and seeds. Beans and lentils. Tomasa BlaseBacon. Hot dogs. Sausage. Dairy High-fat cheeses. Whole milk, chocolate milk, and beverages made with milk, such as milk shakes. Half-and-half. Cream. sour cream. Ice cream. Beverages Caffeinated beverages (such as coffee, tea, soda, or energy drinks). Alcoholic beverages. Fruit juices with pulp. Prune juice. Soft drinks sweetened with high-fructose corn syrup or sugar alcohols. High-calorie sports drinks. Fats and oils Butter. Cream sauces. Margarine. Salad oils. Plain salad dressings. Olives. Avocados. Mayonnaise. Sweets and desserts Sweet rolls, doughnuts, and sweet breads. Sugar-free desserts sweetened with sugar alcohols such as xylitol and sorbitol. Seasoning and other foods Honey. Hot sauce. Chili powder. Gravy. Cream-based or milk-based soups. Pancakes and waffles. Summary  When you have diarrhea, the foods you eat and your eating habits are very important.  Make sure you get at least 8-10 cups of fluid each day, or enough to keep your urine clear or pale yellow.  Eat bland foods and gradually reintroduce healthy, nutrient-rich foods as tolerated, or as told by your health care provider.  Avoid high-fiber, fried, greasy,  or spicy foods. This information is not intended to replace advice given to you by your health care provider. Make sure you discuss any questions you  have with your health care provider. Document Released: 09/28/2003 Document Revised: 07/05/2016 Document Reviewed: 07/05/2016 [employer] Interactive Patient Education  2017 [employer]

## 2017-07-01 NOTE — MAU Provider Note (Signed)
History     CSN: [id number]  Arrival date and time: 07/01/17 [id number]   First Provider Initiated Contact with Patient 07/01/17 [phone]      Chief Complaint  Patient presents with  . Nausea  . Emesis  . Diarrhea   Susan Everett is a 26 y.o. [id number] at 1025w0d who presents today with NVD since last night. Vomited x 12 since then. Diarrhea X 1. Temp 100.8. Had Zofran from EMS on the way.    Emesis   This is a new problem. The current episode started yesterday. The problem occurs more than 10 times per day. The problem has been unchanged. The emesis has an appearance of stomach contents. The maximum temperature recorded prior to her arrival was 100.4 - 100.9 F. The fever has been present for less than 1 day. Risk factors include ill contacts. She has tried acetaminophen for the symptoms. The treatment provided moderate relief.     Past Medical History:  Diagnosis Date  . Anemia   . Hx of chlamydia infection     History reviewed. No pertinent surgical history.  History reviewed. No pertinent family history.  Social History   Tobacco Use  . Smoking status: Former Smoker    Last attempt to quit: 07/22/2010    Years since quitting: 6.9  . Smokeless tobacco: Former Engineer, waterUser  Substance Use Topics  . Alcohol use: No  . Drug use: No    Allergies: No Known Allergies  Medications Prior to Admission  Medication Sig Dispense Refill Last Dose  . Prenatal Vit-Fe Fumarate-FA (PRENATAL MULTIVITAMIN) TABS tablet Take 1 tablet by mouth daily at 12 noon. 100 tablet 3 06/30/2017 at Unknown time  . Pseudoephedrine-APAP-DM (TYLENOL COLD/FLU SEVERE DAY PO) Take 1 Dose by mouth every 6 (six) hours as needed (cold symptoms).    06/30/2017 at Unknown time  . ibuprofen (ADVIL,MOTRIN) 800 MG tablet Take 1 tablet (800 mg total) by mouth every 8 (eight) hours as needed. 45 tablet 1 prn    Review of Systems  Gastrointestinal: Positive for vomiting.   Physical Exam   Blood pressure 121/70, pulse (!) 102,  temperature 97.9 F (36.6 C), resp. rate 18, weight 214 lb (97.1 kg), last menstrual period 03/25/2017, unknown if currently breastfeeding.  Physical Exam  Nursing note and vitals reviewed. Constitutional: She is oriented to person, place, and time. She appears well-developed and well-nourished. No distress.  HENT:  Head: Normocephalic.  Cardiovascular: Normal rate.  Respiratory: Effort normal.  GI: Soft. There is no tenderness. There is no rebound.  Neurological: She is alert and oriented to person, place, and time.  Skin: Skin is warm and dry.  Psychiatric: She has a normal mood and affect.   FHT:+ with bedside US.   Results for orders placed or performed during the hospital encounter of 07/01/17 (from the past 24 hour(s))  Urinalysis, Routine w reflex microscopic     Status: Abnormal   Collection Time: 07/01/17  9:15 AM  Result Value Ref Range   Color, Urine YELLOW YELLOW   APPearance HAZY (A) CLEAR   Specific Gravity, Urine 1.029 1.005 - 1.030   pH 5.0 5.0 - 8.0   Glucose, UA NEGATIVE NEGATIVE mg/dL   Hgb urine dipstick NEGATIVE NEGATIVE   Bilirubin Urine NEGATIVE NEGATIVE   Ketones, ur 20 (A) NEGATIVE mg/dL   Protein, ur 30 (A) NEGATIVE mg/dL   Nitrite NEGATIVE NEGATIVE   Leukocytes, UA NEGATIVE NEGATIVE   RBC / HPF 0-5 0 - 5 RBC/hpf   WBC,  UA 0-5 0 - 5 WBC/hpf   Bacteria, UA NONE SEEN NONE SEEN   Squamous Epithelial / LPF 0-5 (A) NONE SEEN   Mucus PRESENT   Pregnancy, urine POC     Status: Abnormal   Collection Time: 07/01/17  9:37 AM  Result Value Ref Range   Preg Test, Ur POSITIVE (A) NEGATIVE    MAU Course  Procedures  MDM Patient had zofran on EMS truck. No longer vomiting here in MAU. DW patient that rest, and slowly introducing ice chips, then liquids, then bland foods is the best treatment. Will send home with antiemetic.   Assessment and Plan   1. Viral gastroenteritis   2. [redacted] weeks gestation of pregnancy    DC home Comfort measures reviewed  BRAT  diet 2nd trimester precautions reviewed  RX: zofran 8mg  PRN #20  Return to MAU as needed FU with OB as planned  Follow-up Information    Susan Everett, Susan Worema, DO Follow up.   Specialty:  Obstetrics and Gynecology Contact information: [address] [place] [address] [phone]            Susan Everett 07/01/2017, 10:04 AM

## 2017-07-01 NOTE — MAU Note (Signed)
Pt presents to MAU with complaints of N/V/D since last night at 6pm. Denies any VB or pain

## 2017-07-22 NOTE — L&D Delivery Note (Signed)
Delivery Note At 1:59 PM a viable female was delivered via Vaginal, Spontaneous (Presentation: vtx; ROA ).  APGAR: 9, 9; weight pending.   Placenta status: spontaneous, intact.  Cord:  with the following complications: none.  Mild uterine atony responded to massage, IV pitocin and IM Methergine  Anesthesia:  Epidural Episiotomy: None Lacerations: None Suture Repair: none Est. Blood Loss (mL):  300  Mom to postpartum.  Baby to Couplet care / Skin to Skin.  They desire circumcision, will do tomorrow.  Leighton Roachodd D Meric Joye 12/26/2017, 2:23 PM

## 2017-12-02 LAB — OB RESULTS CONSOLE GBS: GBS: NEGATIVE

## 2017-12-17 ENCOUNTER — Telehealth (HOSPITAL_COMMUNITY): Payer: Self-pay | Admitting: *Deleted

## 2017-12-17 NOTE — Telephone Encounter (Signed)
Preadmission screen  

## 2017-12-26 ENCOUNTER — Encounter (HOSPITAL_COMMUNITY): Payer: Self-pay

## 2017-12-26 ENCOUNTER — Inpatient Hospital Stay (HOSPITAL_COMMUNITY)
Admission: RE | Admit: 2017-12-26 | Discharge: 2017-12-28 | DRG: 807 | Disposition: A | Discharge status: Home / Self Care | Payer: Medicaid Other | Attending: Obstetrics and Gynecology | Admitting: Obstetrics and Gynecology

## 2017-12-26 ENCOUNTER — Inpatient Hospital Stay (HOSPITAL_COMMUNITY): Payer: Medicaid Other | Admitting: Anesthesiology

## 2017-12-26 DIAGNOSIS — O99214 Obesity complicating childbirth: Principal | ICD-10-CM | POA: Diagnosis present

## 2017-12-26 DIAGNOSIS — Z87891 Personal history of nicotine dependence: Secondary | ICD-10-CM

## 2017-12-26 DIAGNOSIS — Z3483 Encounter for supervision of other normal pregnancy, third trimester: Secondary | ICD-10-CM | POA: Diagnosis present

## 2017-12-26 DIAGNOSIS — Z3A39 39 weeks gestation of pregnancy: Secondary | ICD-10-CM

## 2017-12-26 LAB — CBC
HEMATOCRIT: 34.1 % — AB (ref 36.0–46.0)
Hemoglobin: 11.2 g/dL — ABNORMAL LOW (ref 12.0–15.0)
MCH: 29.3 pg (ref 26.0–34.0)
MCHC: 32.8 g/dL (ref 30.0–36.0)
MCV: 89.3 fL (ref 78.0–100.0)
Platelets: 219 10*3/uL (ref 150–400)
RBC: 3.82 MIL/uL — AB (ref 3.87–5.11)
RDW: 14.9 % (ref 11.5–15.5)
WBC: 6.2 10*3/uL (ref 4.0–10.5)

## 2017-12-26 LAB — TYPE AND SCREEN
ABO/RH(D): B NEG
Antibody Screen: NEGATIVE

## 2017-12-26 MED ORDER — PRENATAL MULTIVITAMIN CH
1.0000 | ORAL_TABLET | Freq: Every day | ORAL | Status: DC
Start: 2017-12-27 — End: 2017-12-28
  Administered 2017-12-27 – 2017-12-28 (×2): 1 via ORAL
  Filled 2017-12-26 (×2): qty 1

## 2017-12-26 MED ORDER — LIDOCAINE HCL (PF) 1 % IJ SOLN
30.0000 mL | INTRAMUSCULAR | Status: DC | PRN
  Filled 2017-12-26: qty 30

## 2017-12-26 MED ORDER — PHENYLEPHRINE 40 MCG/ML (10ML) SYRINGE FOR IV PUSH (FOR BLOOD PRESSURE SUPPORT)
80.0000 ug | PREFILLED_SYRINGE | INTRAVENOUS | Status: DC | PRN
  Filled 2017-12-26: qty 5

## 2017-12-26 MED ORDER — ZOLPIDEM TARTRATE 5 MG PO TABS: 5.0000 mg | ORAL_TABLET | Freq: Every evening | ORAL | Status: DC | PRN

## 2017-12-26 MED ORDER — ONDANSETRON HCL 4 MG PO TABS
4.0000 mg | ORAL_TABLET | ORAL | Status: DC | PRN
Start: 2017-12-26 — End: 2017-12-28

## 2017-12-26 MED ORDER — LACTATED RINGERS IV SOLN: 500.0000 mL | INTRAVENOUS | Status: DC | PRN

## 2017-12-26 MED ORDER — EPHEDRINE 5 MG/ML INJ
10.0000 mg | INTRAVENOUS | Status: DC | PRN
  Filled 2017-12-26: qty 2

## 2017-12-26 MED ORDER — BENZOCAINE-MENTHOL 20-0.5 % EX AERO: 1.0000 "application " | INHALATION_SPRAY | CUTANEOUS | Status: DC | PRN

## 2017-12-26 MED ORDER — OXYTOCIN 40 UNITS IN LACTATED RINGERS INFUSION - SIMPLE MED
1.0000 m[IU]/min | INTRAVENOUS | Status: DC
  Administered 2017-12-26: 2 m[IU]/min via INTRAVENOUS
  Filled 2017-12-26: qty 1000

## 2017-12-26 MED ORDER — PHENYLEPHRINE 40 MCG/ML (10ML) SYRINGE FOR IV PUSH (FOR BLOOD PRESSURE SUPPORT)
80.0000 ug | PREFILLED_SYRINGE | INTRAVENOUS | Status: DC | PRN
  Filled 2017-12-26: qty 10
  Filled 2017-12-26: qty 5

## 2017-12-26 MED ORDER — MEASLES, MUMPS & RUBELLA VAC ~~LOC~~ INJ: 0.5000 mL | INJECTION | Freq: Once | SUBCUTANEOUS | Status: DC

## 2017-12-26 MED ORDER — OXYCODONE-ACETAMINOPHEN 5-325 MG PO TABS: 1.0000 | ORAL_TABLET | ORAL | Status: DC | PRN

## 2017-12-26 MED ORDER — ONDANSETRON HCL 4 MG/2ML IJ SOLN: 4.0000 mg | Freq: Four times a day (QID) | INTRAMUSCULAR | Status: DC | PRN

## 2017-12-26 MED ORDER — TERBUTALINE SULFATE 1 MG/ML IJ SOLN
0.2500 mg | Freq: Once | INTRAMUSCULAR | Status: DC | PRN
  Filled 2017-12-26: qty 1

## 2017-12-26 MED ORDER — DIBUCAINE 1 % RE OINT: 1.0000 "application " | TOPICAL_OINTMENT | RECTAL | Status: DC | PRN

## 2017-12-26 MED ORDER — COCONUT OIL OIL: 1.0000 "application " | TOPICAL_OIL | Status: DC | PRN

## 2017-12-26 MED ORDER — DIPHENHYDRAMINE HCL 25 MG PO CAPS: 25.0000 mg | ORAL_CAPSULE | Freq: Four times a day (QID) | ORAL | Status: DC | PRN

## 2017-12-26 MED ORDER — ONDANSETRON HCL 4 MG/2ML IJ SOLN: 4.0000 mg | INTRAMUSCULAR | Status: DC | PRN

## 2017-12-26 MED ORDER — METHYLERGONOVINE MALEATE 0.2 MG/ML IJ SOLN
INTRAMUSCULAR | Status: AC
  Filled 2017-12-26: qty 1

## 2017-12-26 MED ORDER — WITCH HAZEL-GLYCERIN EX PADS: 1.0000 "application " | MEDICATED_PAD | CUTANEOUS | Status: DC | PRN

## 2017-12-26 MED ORDER — METHYLERGONOVINE MALEATE 0.2 MG PO TABS: 0.2000 mg | ORAL_TABLET | ORAL | Status: DC | PRN

## 2017-12-26 MED ORDER — TETANUS-DIPHTH-ACELL PERTUSSIS 5-2.5-18.5 LF-MCG/0.5 IM SUSP: 0.5000 mL | Freq: Once | INTRAMUSCULAR | Status: DC

## 2017-12-26 MED ORDER — OXYCODONE-ACETAMINOPHEN 5-325 MG PO TABS: 2.0000 | ORAL_TABLET | ORAL | Status: DC | PRN

## 2017-12-26 MED ORDER — LACTATED RINGERS IV SOLN
INTRAVENOUS | Status: DC
  Administered 2017-12-26: 08:00:00 via INTRAVENOUS

## 2017-12-26 MED ORDER — IBUPROFEN 600 MG PO TABS
600.0000 mg | ORAL_TABLET | Freq: Four times a day (QID) | ORAL | Status: DC
  Administered 2017-12-26 – 2017-12-28 (×8): 600 mg via ORAL
  Filled 2017-12-26 (×8): qty 1

## 2017-12-26 MED ORDER — OXYTOCIN BOLUS FROM INFUSION
500.0000 mL | Freq: Once | INTRAVENOUS | Status: AC
  Administered 2017-12-26: 500 mL via INTRAVENOUS

## 2017-12-26 MED ORDER — OXYTOCIN 40 UNITS IN LACTATED RINGERS INFUSION - SIMPLE MED: 2.5000 [IU]/h | INTRAVENOUS | Status: DC

## 2017-12-26 MED ORDER — SIMETHICONE 80 MG PO CHEW: 80.0000 mg | CHEWABLE_TABLET | ORAL | Status: DC | PRN

## 2017-12-26 MED ORDER — METHYLERGONOVINE MALEATE 0.2 MG/ML IJ SOLN: 0.2000 mg | INTRAMUSCULAR | Status: DC | PRN

## 2017-12-26 MED ORDER — DIPHENHYDRAMINE HCL 50 MG/ML IJ SOLN: 12.5000 mg | INTRAMUSCULAR | Status: DC | PRN

## 2017-12-26 MED ORDER — BUTORPHANOL TARTRATE 1 MG/ML IJ SOLN: 1.0000 mg | INTRAMUSCULAR | Status: DC | PRN

## 2017-12-26 MED ORDER — METHYLERGONOVINE MALEATE 0.2 MG/ML IJ SOLN
0.2000 mg | Freq: Once | INTRAMUSCULAR | Status: AC
  Administered 2017-12-26: 0.2 mg via INTRAMUSCULAR

## 2017-12-26 MED ORDER — OXYCODONE HCL 5 MG PO TABS: 5.0000 mg | ORAL_TABLET | ORAL | Status: DC | PRN

## 2017-12-26 MED ORDER — LACTATED RINGERS IV SOLN: 500.0000 mL | Freq: Once | INTRAVENOUS | Status: DC

## 2017-12-26 MED ORDER — LIDOCAINE HCL (PF) 1 % IJ SOLN
INTRAMUSCULAR | Status: DC | PRN
  Administered 2017-12-26: 7 mL via EPIDURAL
  Administered 2017-12-26: 6 mL via EPIDURAL

## 2017-12-26 MED ORDER — SOD CITRATE-CITRIC ACID 500-334 MG/5ML PO SOLN: 30.0000 mL | ORAL | Status: DC | PRN

## 2017-12-26 MED ORDER — ACETAMINOPHEN 325 MG PO TABS: 650.0000 mg | ORAL_TABLET | ORAL | Status: DC | PRN

## 2017-12-26 MED ORDER — ERYTHROMYCIN 5 MG/GM OP OINT
TOPICAL_OINTMENT | OPHTHALMIC | Status: AC
  Filled 2017-12-26: qty 1

## 2017-12-26 MED ORDER — OXYCODONE HCL 5 MG PO TABS: 10.0000 mg | ORAL_TABLET | ORAL | Status: DC | PRN

## 2017-12-26 MED ORDER — FENTANYL 2.5 MCG/ML BUPIVACAINE 1/10 % EPIDURAL INFUSION (WH - ANES)
14.0000 mL/h | INTRAMUSCULAR | Status: DC | PRN
  Administered 2017-12-26: 14 mL/h via EPIDURAL
  Filled 2017-12-26: qty 100

## 2017-12-26 MED ORDER — MAGNESIUM HYDROXIDE 400 MG/5ML PO SUSP: 30.0000 mL | ORAL | Status: DC | PRN

## 2017-12-26 MED ORDER — SENNOSIDES-DOCUSATE SODIUM 8.6-50 MG PO TABS
2.0000 | ORAL_TABLET | ORAL | Status: DC
  Administered 2017-12-26 – 2017-12-27 (×2): 2 via ORAL
  Filled 2017-12-26 (×2): qty 2

## 2017-12-26 NOTE — Anesthesia Preprocedure Evaluation (Signed)
Anesthesia Evaluation  Patient identified by MRN, date of birth, ID band Patient awake    Reviewed: Allergy & Precautions, NPO status , Patient's Chart, lab work & pertinent test results  Airway Mallampati: II  TM Distance: >3 FB Neck ROM: Full    Dental no notable dental hx. (+) Teeth Intact   Pulmonary former smoker,    Pulmonary exam normal breath sounds clear to auscultation       Cardiovascular negative cardio ROS Normal cardiovascular exam Rhythm:Regular Rate:Normal     Neuro/Psych negative neurological ROS  negative psych ROS   GI/Hepatic negative GI ROS, Neg liver ROS,   Endo/Other  Morbid obesity  Renal/GU negative Renal ROS     Musculoskeletal negative musculoskeletal ROS (+)   Abdominal (+) + obese,   Peds  Hematology  (+) anemia ,   Anesthesia Other Findings   Reproductive/Obstetrics (+) Pregnancy                             Anesthesia Physical  Anesthesia Plan  ASA: III  Anesthesia Plan: Epidural   Post-op Pain Management:    Induction:   PONV Risk Score and Plan:   Airway Management Planned:   Additional Equipment:   Intra-op Plan:   Post-operative Plan:   Informed Consent: I have reviewed the patients History and Physical, chart, labs and discussed the procedure including the risks, benefits and alternatives for the proposed anesthesia with the patient or authorized representative who has indicated his/her understanding and acceptance.     Plan Discussed with:   Anesthesia Plan Comments:         Anesthesia Quick Evaluation

## 2017-12-26 NOTE — Anesthesia Procedure Notes (Signed)
Epidural Patient location during procedure: OB Start time: 12/26/2017 11:58 AM End time: 12/26/2017 12:06 PM  Staffing Anesthesiologist: Leilani AbleHatchett, Kirandeep Fariss, MD Performed: anesthesiologist   Preanesthetic Checklist Completed: patient identified, site marked, surgical consent, pre-op evaluation, timeout performed, IV checked, risks and benefits discussed and monitors and equipment checked  Epidural Patient position: sitting Prep: site prepped and draped and DuraPrep Patient monitoring: continuous pulse ox and blood pressure Approach: midline Location: L3-L4 Injection technique: LOR air  Needle:  Needle type: Tuohy  Needle gauge: 17 G Needle length: 9 cm and 9 Needle insertion depth: 8 cm Catheter type: closed end flexible Catheter size: 19 Gauge Catheter at skin depth: 14 cm Test dose: negative and Other  Assessment Sensory level: T9 Events: blood not aspirated, injection not painful, no injection resistance, negative IV test and no paresthesia  Additional Notes Reason for block:procedure for pain

## 2017-12-26 NOTE — Anesthesia Pain Management Evaluation Note (Signed)
  CRNA Pain Management Visit Note  Patient: Susan Everett, 27 y.o., female  "Hello I am a member of the anesthesia team at [facility]. We have an anesthesia team available at all times to provide care throughout the hospital, including epidural management and anesthesia for C-section. I don't know your plan for the delivery whether it a natural birth, water birth, IV sedation, nitrous supplementation, doula or epidural, but we want to meet your pain goals."   1.Was your pain managed to your expectations on prior hospitalizations?   Yes   2.What is your expectation for pain management during this hospitalization?     Epidural  3.How can we help you reach that goal? epidural  Record the patient's initial score and the patient's pain goal.   Pain: 0  Pain Goal: 4 [facility] wants you to be able to say your pain was always managed very well.  Mica Ramdass 12/26/2017

## 2017-12-26 NOTE — H&P (Signed)
Susan Everett is a 27 y.o. female, G3 P1011, EGA 39+ weeks [facility] presenting for elective induction.  Prenatal care uncomplicated.  OB History    Gravida  3   Para  2   Term  1   Preterm  1   AB  0   Living  2     SAB  0   TAB  0   Ectopic  0   Multiple  0   Live Births  2          Past Medical History:  Diagnosis Date  . Anemia   . Hx of chlamydia infection    History reviewed. No pertinent surgical history. Family History: family history is not on file. Social History:  reports that she quit smoking about 7 years ago. She has quit using smokeless tobacco. She reports that she does not drink alcohol or use drugs.     Maternal Diabetes: No Genetic Screening: Normal Maternal Ultrasounds/Referrals: Normal Fetal Ultrasounds or other Referrals:  None Maternal Substance Abuse:  No Significant Maternal Medications:  None Significant Maternal Lab Results:  Lab values include: Group B Strep negative Other Comments:  None  Review of Systems  Respiratory: Negative.   Cardiovascular: Negative.    Maternal Medical History:  Contractions: Perceived severity is mild.    Fetal activity: Perceived fetal activity is normal.    Prenatal complications: no prenatal complications Prenatal Complications - Diabetes: none.    Dilation: 3 Effacement (%): 30 Station: -2 Exam by:: DR Takaya Hyslop  Blood pressure 136/77, pulse 90, temperature 97.7 F (36.5 C), temperature source Oral, height 5\' 4"  (1.626 m), weight 105.9 kg (233 lb 8 oz), last menstrual period 03/25/2017, unknown if currently breastfeeding. Maternal Exam:  Uterine Assessment: Contraction strength is mild.  Contraction frequency is rare.   Abdomen: Patient reports no abdominal tenderness. Estimated fetal weight is 7 1/2 lbs.   Fetal presentation: vertex  Introitus: Normal vulva. Normal vagina.  Amniotic fluid character: not assessed.  Pelvis: adequate for delivery.   Cervix: Cervix evaluated by  digital exam.     Fetal Exam Fetal Monitor Review: Mode: ultrasound.   Baseline rate: 130-140.  Variability: moderate (6-25 bpm).   Pattern: accelerations present and no decelerations.    Fetal State Assessment: Category I - tracings are normal.     Physical Exam  Vitals reviewed. Constitutional: She appears well-developed and well-nourished.  Cardiovascular: Normal rate and regular rhythm.  Respiratory: Effort normal. No respiratory distress.  GI: Soft.    Prenatal labs: ABO, Rh: B/Negative/-- (12/05 0000) Antibody: Negative (12/05 0000) Rubella: Immune (12/05 0000) RPR: Nonreactive (12/05 0000)  HBsAg: Negative (12/05 0000)  HIV: Non-reactive (12/05 0000)  GBS: Negative (05/14 0000)   Assessment/Plan: IUP at 39+ weeks with favorable cervix for induction.  Will start pitocin, monitor progress, anticipate SVD   Leighton Roachodd D Drevon Plog 12/26/2017, 8:15 AM

## 2017-12-26 NOTE — Progress Notes (Signed)
Comfortable with epidural, had SROM at around 1130 Afeb, VSS FHT- 130s, mod variability, variable decels, Cat II, ctx not tracing very well VE-6/80/-1, vtx Continue pitocin, try position change for variables, monitor progress, anticipate SVD

## 2017-12-27 ENCOUNTER — Other Ambulatory Visit: Payer: Self-pay

## 2017-12-27 LAB — KLEIHAUER-BETKE STAIN
# Vials RhIg: 1
FETAL CELLS %: 0 %
Quantitation Fetal Hemoglobin: 0 mL

## 2017-12-27 MED ORDER — RHO D IMMUNE GLOBULIN 1500 UNIT/2ML IJ SOSY
300.0000 ug | PREFILLED_SYRINGE | Freq: Once | INTRAMUSCULAR | Status: AC
  Administered 2017-12-27: 300 ug via INTRAVENOUS
  Filled 2017-12-27: qty 2

## 2017-12-27 MED ORDER — IBUPROFEN 600 MG PO TABS: 600.0000 mg | ORAL_TABLET | Freq: Four times a day (QID) | ORAL | 0 refills | Status: DC

## 2017-12-27 NOTE — Progress Notes (Signed)
CSW received consult for patient due to her EPDS score being 11. CSW met with patient, spouse [name], and newborn [name] at bedside. CSW obtained permission from patient to discuss in front of spouse. Patient stated that she remembers completing the EPDS assessment but did not recall what questions she answered that would have triggered a high score. CSW inquired with patient about moods before and after delivery, reporting that all moods have been good and full of excitement. Patient denies any past history of anxiety or depression. CSW educated patient on baby blues period versus postpartum depression. Patient denies suicidal or homicidal ideations. CSW encouraged patient to reach out for assistance if needs arise before or after discharge.  Susan Everett, MSW, [place] Social Worker [facility] [phone]

## 2017-12-27 NOTE — Progress Notes (Signed)
PPD #1 No problems, wants to go home Afeb, VSS Fundus firm, NT at U-1 Continue routine postpartum care, d/c home this pm, will do circ in the office

## 2017-12-27 NOTE — Lactation Note (Signed)
This note was copied from a baby's chart. Lactation Consultation Note  Patient Name: Susan Everett [place] Today's Date: 12/27/2017 Reason for consult: Initial assessment;Term  4324 hours old FT female who is being partially BF and formula fed by his mother, she's a P3 and somehow experienced with BF. She was able to BF her last child for two months but she didn't BF her first one. She already knows how to hand express, she got a hand pump to take home (since she doesn't have one) and her RN has already set her up with a DEBP.  Per mom feedings at the breast are comfortable and she self reported having no pain or signs of trauma on her nipples. She wasn't sure if she hears baby swallowing at the breast but she noted a little bit of milk coming out the corners of baby's mouth. Baby is also getting supplemented with Gerber Gentle. Discussed with mom that she may need to recalibrating the amount of formula given once her milk comes in. Mom is drawn to more pump and bottle feeding though, she plans to do so once she goes home. She requested information about pump rental and what type of pumps to purchase. Mom reported no further concerns.    Encouraged mom to feed baby 8-12 times/24 hours or sooner if feeding cues are present. Mom will try to priorizing BF over formula feeding. BF brochure, BF resources and feeding diary were reviewed, mom is aware of LC services and will call PRN.  Maternal Data Formula Feeding for Exclusion: Yes Reason for exclusion: Mother's choice to formula feed on admision Has patient been taught Hand Expression?: Yes Does the patient have breastfeeding experience prior to this delivery?: Yes  Feeding   Interventions Interventions: Breast feeding basics reviewed  Lactation Tools Discussed/Used Tools: Pump Breast pump type: Double-Electric Breast Pump;Manual WIC Program: Yes Pump Review: Setup, frequency, and cleaning Initiated by:: RN Date initiated:: 12/27/17   Consult  Status Consult Status: Follow-up Date: 12/28/17 Follow-up type: In-patient    Susan Everett 12/27/2017, 2:05 PM

## 2017-12-27 NOTE — Discharge Summary (Addendum)
    OB Discharge Summary     Patient Name: Susan Everett DOB: 1991/07/16 MRN: [id number]  Date of admission: 12/26/2017 Delivering MD: [facility], Rogers Ditter   Date of discharge: 12/28/2017  Admitting diagnosis: INDUCTION Intrauterine pregnancy: 5122w4d     Secondary diagnosis:  Active Problems:   Indication for care in labor or delivery      Discharge diagnosis: Term Pregnancy Delivered             Hospital course:  Induction of Labor With Vaginal Delivery   27 y.o. yo [id number] at 7522w4d was admitted to the hospital 12/26/2017 for induction of labor.  Indication for induction: Favorable cervix at term.  Patient had an uncomplicated labor course as follows: Membrane Rupture Time/Date: 11:28 AM ,12/26/2017   Intrapartum Procedures: Episiotomy: None [1]                                         Lacerations:  None [1]  Patient had delivery of a Viable infant.  Information for the patient's newborn:  Western Everett, Boy Grete [[id number]][[id number]]  Delivery Method: Vag-Spont   12/26/2017  Details of delivery can be found in separate delivery note.  Patient had a routine postpartum course. Patient is discharged home on PPD #2.  Physical exam  Vitals:   12/26/17 1645 12/26/17 2045 12/27/17 0300 12/27/17 0454  BP: 139/74 127/69 121/65 121/65  Pulse: 96 93 82 82  Resp: 16 18  18   Temp: 98.1 F (36.7 C) 98.9 F (37.2 C) 97.9 F (36.6 C) 97.9 F (36.6 C)  TempSrc: Oral Oral Oral Oral  SpO2:      Weight:      Height:       General: alert Lochia: appropriate Uterine Fundus: firm  Labs: Lab Results  Component Value Date   WBC 6.2 12/26/2017   HGB 11.2 (L) 12/26/2017   HCT 34.1 (L) 12/26/2017   MCV 89.3 12/26/2017   PLT 219 12/26/2017   CMP Latest Ref Rng & Units 08/05/2016  Glucose 70 - 140 mg/dl 99  BUN 7.0 - 84.126.0 mg/dL 6.1(L)  Creatinine 0.6 - 1.1 mg/dL 0.7  Sodium 324136 - 401145 mEq/L 138  Potassium 3.5 - 5.1 mEq/L 3.8  Chloride 101 - 111 mmol/L -  CO2 22 - 29 mEq/L 23  Calcium 8.4 - 10.4 mg/dL 8.8   Total Protein 6.4 - 8.3 g/dL 6.5  Total Bilirubin 0.270.20 - 1.20 mg/dL 2.530.29  Alkaline Phos 40 - 150 U/L 148  AST 5 - 34 U/L 15  ALT 0 - 55 U/L 20    Discharge instruction: per After Visit Summary and "Baby and Me Booklet".  After visit meds:  Allergies as of 12/27/2017   No Known Allergies     Medication List    TAKE these medications   ibuprofen 600 MG tablet Commonly known as:  ADVIL,MOTRIN Take 1 tablet (600 mg total) by mouth every 6 (six) hours.       Diet: routine diet  Activity: Advance as tolerated. Pelvic rest for 6 weeks.   Outpatient follow up:6 weeks  Newborn Data: Live born female  Birth Weight: 7 lb 6 oz (3345 g) APGAR: 9, 9  Newborn Delivery   Birth date/time:  12/26/2017 13:59:00 Delivery type:  Vaginal, Spontaneous     Baby Feeding: Breast Disposition:home with mother   12/27/2017 Zenaida Nieceodd D Rafi Kenneth, MD

## 2017-12-27 NOTE — Anesthesia Postprocedure Evaluation (Signed)
Anesthesia Post Note  Patient: Susan Everett  Procedure(s) Performed: AN AD HOC LABOR EPIDURAL     Patient location during evaluation: Mother Baby Anesthesia Type: Epidural Level of consciousness: awake and alert and oriented Pain management: satisfactory to patient Vital Signs Assessment: post-procedure vital signs reviewed and stable Respiratory status: spontaneous breathing and nonlabored ventilation Cardiovascular status: stable Postop Assessment: no headache, no backache, no signs of nausea or vomiting, adequate PO intake, patient able to bend at knees and able to ambulate (patient up walking) Anesthetic complications: no    Last Vitals:  Vitals:   12/27/17 0300 12/27/17 0454  BP: 121/65 121/65  Pulse: 82 82  Resp:  18  Temp: 36.6 C 36.6 C  SpO2:      Last Pain:  Vitals:   12/27/17 0454  TempSrc: Oral  PainSc: 4    Pain Goal: Patients Stated Pain Goal: 6 (12/26/17 0758)               Madison HickmanGREGORY,Ryot Burrous

## 2017-12-27 NOTE — Discharge Instructions (Signed)
As per discharge pamphlet °

## 2017-12-28 ENCOUNTER — Encounter (HOSPITAL_COMMUNITY): Payer: Self-pay | Admitting: *Deleted

## 2017-12-28 LAB — RPR: RPR: NONREACTIVE

## 2017-12-28 LAB — RH IG WORKUP (INCLUDES ABO/RH)
ABO/RH(D): B NEG
Gestational Age(Wks): 39.3
UNIT DIVISION: 0

## 2017-12-28 NOTE — Progress Notes (Signed)
PPD #2 Doing well, did not go home yesterday due to issues with the baby Afeb, VSS D/c home

## 2018-05-08 ENCOUNTER — Other Ambulatory Visit: Payer: Self-pay

## 2018-05-08 ENCOUNTER — Emergency Department (HOSPITAL_COMMUNITY)
Admission: EM | Admit: 2018-05-08 | Discharge: 2018-05-08 | Disposition: A | Discharge status: Home / Self Care | Payer: Medicaid Other | Attending: Emergency Medicine | Admitting: Emergency Medicine

## 2018-05-08 ENCOUNTER — Encounter (HOSPITAL_COMMUNITY): Payer: Self-pay | Admitting: Emergency Medicine

## 2018-05-08 DIAGNOSIS — M5412 Radiculopathy, cervical region: Secondary | ICD-10-CM | POA: Insufficient documentation

## 2018-05-08 DIAGNOSIS — Z87891 Personal history of nicotine dependence: Secondary | ICD-10-CM | POA: Insufficient documentation

## 2018-05-08 DIAGNOSIS — M436 Torticollis: Secondary | ICD-10-CM | POA: Insufficient documentation

## 2018-05-08 MED ORDER — KETOROLAC TROMETHAMINE 30 MG/ML IJ SOLN
30.0000 mg | Freq: Once | INTRAMUSCULAR | Status: AC
Start: 2018-05-08 — End: 2018-05-08
  Administered 2018-05-08: 30 mg via INTRAMUSCULAR
  Filled 2018-05-08: qty 1

## 2018-05-08 MED ORDER — METHOCARBAMOL 500 MG PO TABS: 500.0000 mg | ORAL_TABLET | Freq: Two times a day (BID) | ORAL | 0 refills | Status: DC

## 2018-05-08 MED ORDER — PREDNISONE 10 MG (21) PO TBPK: ORAL_TABLET | Freq: Every day | ORAL | 0 refills | Status: DC

## 2018-05-08 MED ORDER — OXYCODONE-ACETAMINOPHEN 5-325 MG PO TABS: 1.0000 | ORAL_TABLET | Freq: Three times a day (TID) | ORAL | 0 refills | Status: DC | PRN

## 2018-05-08 MED ORDER — PREDNISONE 20 MG PO TABS
60.0000 mg | ORAL_TABLET | Freq: Once | ORAL | Status: AC
  Administered 2018-05-08: 60 mg via ORAL
  Filled 2018-05-08: qty 3

## 2018-05-08 NOTE — ED Provider Notes (Signed)
[facility] EMERGENCY DEPARTMENT Provider Note   CSN: [id number] Arrival date & time: 05/08/18  1713     History   Chief Complaint Chief Complaint  Patient presents with  . Neck Pain    HPI Susan Everett is a 27 y.o. female who presents to ED for evaluation of 5-day history of neck spasm and sharp shooting pain radiating down both of her arms.  States that symptoms initially began as a "crick in my neck."  This began after she woke up from her sleep.  States that this has improved but she is now having a pressure-like sensation in her neck and sharp shooting pain down both of her arms.  States that this will depend on how she moves her neck.  She denies any neck stiffness, fever, rashes, injuries or falls, prior neck surgeries, chest pain, vision changes, headache, numbness in arms or legs, weakness of arms or legs.  HPI  Past Medical History:  Diagnosis Date  . Anemia   . Hx of chlamydia infection     Patient Active Problem List   Diagnosis Date Noted  . Indication for care in labor or delivery 12/26/2017  . Normal pregnancy in third trimester 10/14/2016  . SVD (spontaneous vaginal delivery) 10/14/2016  . Anemia affecting pregnancy in third trimester 08/05/2016    History reviewed. No pertinent surgical history.   OB History    Gravida  3   Para  2   Term  1   Preterm  1   AB  0   Living  2     SAB  0   TAB  0   Ectopic  0   Multiple  0   Live Births  2            Home Medications    Prior to Admission medications   Medication Sig Start Date End Date Taking? Authorizing Provider  ibuprofen (ADVIL,MOTRIN) 600 MG tablet Take 1 tablet (600 mg total) by mouth every 6 (six) hours. 12/27/17   Meisinger, Tawanna Cooler, MD  methocarbamol (ROBAXIN) 500 MG tablet Take 1 tablet (500 mg total) by mouth 2 (two) times daily. 05/08/18   Leelynd Maldonado, PA-C  oxyCODONE-acetaminophen (PERCOCET/ROXICET) 5-325 MG tablet Take 1 tablet by mouth every 8  (eight) hours as needed for severe pain. 05/08/18   Spiro Ausborn, PA-C  predniSONE (STERAPRED UNI-PAK 21 TAB) 10 MG (21) TBPK tablet Take by mouth daily. Take 6 tabs by mouth daily  for 1 days, then 5 tabs for 2 days, then 4 tabs for 2 days, then 3 tabs for 2 days, 2 tabs for 2 days, then 1 tab by mouth daily for 2 days 05/08/18   Dietrich Pates, PA-C    Family History No family history on file.  Social History Social History   Tobacco Use  . Smoking status: Former Smoker    Last attempt to quit: 07/22/2010    Years since quitting: 7.8  . Smokeless tobacco: Former Engineer, water Use Topics  . Alcohol use: No  . Drug use: No     Allergies   Patient has no known allergies.   Review of Systems Review of Systems  Constitutional: Negative for fever.  Cardiovascular: Negative for chest pain.  Musculoskeletal: Positive for myalgias and neck pain. Negative for gait problem, joint swelling and neck stiffness.  Skin: Negative for wound.  Neurological: Negative for weakness and numbness.     Physical Exam Updated Vital Signs BP (!) 156/98  Pulse 98   Temp 98.9 F (37.2 C) (Oral)   Resp 18   Ht 5\' 4"  (1.626 m)   Wt 99.8 kg   LMP 05/08/2018   SpO2 100%   BMI 37.76 kg/m   Physical Exam  Constitutional: She appears well-developed and well-nourished. No distress.  Tearful and anxious.  HENT:  Head: Normocephalic and atraumatic.  Eyes: Conjunctivae and EOM are normal. No scleral icterus.  Neck: Normal range of motion.    No meningismus.  Diffuse tenderness palpation of paraspinal musculature.  Full active and passive range of motion demonstrated.  Cardiovascular: Normal rate, regular rhythm and normal heart sounds.  Pulmonary/Chest: Effort normal. No respiratory distress.  Neurological: She is alert. No sensory deficit. She exhibits normal muscle tone.  Strength 5/5 for bilateral upper extremities.  Equal grip strength bilaterally.  Sensation intact to light touch of  bilateral upper extremities.  Skin: No rash noted. She is not diaphoretic.  Psychiatric: She has a normal mood and affect.  Nursing note and vitals reviewed.    ED Treatments / Results  Labs (all labs ordered are listed, but only abnormal results are displayed) Labs Reviewed - No data to display  EKG None  Radiology No results found.  Procedures Procedures (including critical care time)  Medications Ordered in ED Medications  ketorolac (TORADOL) 30 MG/ML injection 30 mg (has no administration in time range)  predniSONE (DELTASONE) tablet 60 mg (has no administration in time range)     Initial Impression / Assessment and Plan / ED Course  I have reviewed the triage vital signs and the nursing notes.  Pertinent labs & imaging results that were available during my care of the patient were reviewed by me and considered in my medical decision making (see chart for details).     27 year old female presents to ED for neck pain for the past 5 days.  She reports having spasms and sharp shooting pain down her arms.  No improvement noted with IcyHot in the area.  On exam there is no meningismus noted.  She is able to demonstrate full active and passive range of motion of the neck.  No stiffness noted.  Upper extremities are neurovascularly intact bilaterally.  No changes to sensation or weakness noted.  She denies any injuries or falls.  I do not see any need for imaging at this time as she does not have a history of any traumatic injury to the area.  Suspect that symptoms are due to radiculopathy.  Doubt infectious cause such as meningitis.  Will give prednisone pack, muscle relaxer and short course of pain medication.  Will advise her to follow-up with spine specialist and return to the ED for any severe worsening symptoms. [place] PMP queried with no discrepancies.  Portions of this note were generated with Scientist, clinical (histocompatibility and immunogenetics). Dictation errors may occur despite best attempts at  proofreading.   Final Clinical Impressions(s) / ED Diagnoses   Final diagnoses:  Cervical radiculopathy  Torticollis    ED Discharge Orders         Ordered    oxyCODONE-acetaminophen (PERCOCET/ROXICET) 5-325 MG tablet  Every 8 hours PRN     05/08/18 1915    predniSONE (STERAPRED UNI-PAK 21 TAB) 10 MG (21) TBPK tablet  Daily     05/08/18 1915    methocarbamol (ROBAXIN) 500 MG tablet  2 times daily     05/08/18 1915           Dietrich Pates, [place] 05/08/18 1919  Benjiman Core, MD 05/09/18 [phone]

## 2018-05-08 NOTE — ED Triage Notes (Signed)
Pt reports her pain started as a crook in her neck 5 days ago. No injuries. Pt reports the "crook is gone." Pt now reports a dull pressure down the middle of her neck "like a bowling ball sitting on her neck." Pt reports pain radiates to her shoulders and now the pain shoots down her right arm with right hand tingling since yesterday. No neuro sx in triage.

## 2018-05-08 NOTE — Discharge Instructions (Signed)
Return to ED if you start to develop a fever, injuries or falls, weakness in your arms or legs, trouble walking, chest pain or shortness of breath.

## 2019-08-27 DIAGNOSIS — Z20828 Contact with and (suspected) exposure to other viral communicable diseases: Secondary | ICD-10-CM | POA: Diagnosis not present

## 2019-12-07 ENCOUNTER — Emergency Department (HOSPITAL_COMMUNITY): Payer: BC Managed Care – PPO

## 2019-12-07 ENCOUNTER — Other Ambulatory Visit: Payer: Self-pay

## 2019-12-07 ENCOUNTER — Encounter (HOSPITAL_COMMUNITY): Payer: Self-pay

## 2019-12-07 ENCOUNTER — Emergency Department (HOSPITAL_COMMUNITY)
Admission: EM | Admit: 2019-12-07 | Discharge: 2019-12-08 | Disposition: A | Discharge status: Home / Self Care | Payer: BC Managed Care – PPO | Attending: Emergency Medicine | Admitting: Emergency Medicine

## 2019-12-07 DIAGNOSIS — Z5321 Procedure and treatment not carried out due to patient leaving prior to being seen by health care provider: Secondary | ICD-10-CM | POA: Insufficient documentation

## 2019-12-07 DIAGNOSIS — R079 Chest pain, unspecified: Secondary | ICD-10-CM | POA: Diagnosis not present

## 2019-12-07 LAB — CBC
HCT: 39.9 % (ref 36.0–46.0)
Hemoglobin: 12.8 g/dL (ref 12.0–15.0)
MCH: 28.7 pg (ref 26.0–34.0)
MCHC: 32.1 g/dL (ref 30.0–36.0)
MCV: 89.5 fL (ref 80.0–100.0)
Platelets: 302 10*3/uL (ref 150–400)
RBC: 4.46 MIL/uL (ref 3.87–5.11)
RDW: 13.3 % (ref 11.5–15.5)
WBC: 8.8 10*3/uL (ref 4.0–10.5)
nRBC: 0 % (ref 0.0–0.2)

## 2019-12-07 LAB — BASIC METABOLIC PANEL
Anion gap: 8 (ref 5–15)
BUN: 10 mg/dL (ref 6–20)
CO2: 25 mmol/L (ref 22–32)
Calcium: 9.2 mg/dL (ref 8.9–10.3)
Chloride: 104 mmol/L (ref 98–111)
Creatinine, Ser: 0.87 mg/dL (ref 0.44–1.00)
GFR calc Af Amer: >60 mL/min (ref >60)
GFR calc non Af Amer: >60 mL/min (ref >60)
Glucose, Bld: 118 mg/dL — ABNORMAL HIGH (ref 70–99)
Potassium: 4 mmol/L (ref 3.5–5.1)
Sodium: 137 mmol/L (ref 135–145)

## 2019-12-07 LAB — TROPONIN I (HIGH SENSITIVITY): Troponin I (High Sensitivity): <2 ng/L (ref <18)

## 2019-12-07 LAB — I-STAT BETA HCG BLOOD, ED (MC, WL, AP ONLY): I-stat hCG, quantitative: <5 m[IU]/mL (ref <5)

## 2019-12-07 MED ORDER — SODIUM CHLORIDE 0.9% FLUSH: 3.0000 mL | Freq: Once | INTRAVENOUS | Status: DC

## 2019-12-07 NOTE — ED Triage Notes (Signed)
Pt arrives to ED w/ c/o central located, non-radiating 8/10 chest pain and sob. Pt denies n/v. Pt denies cardiac hx.

## 2019-12-08 DIAGNOSIS — Z03818 Encounter for observation for suspected exposure to other biological agents ruled out: Secondary | ICD-10-CM | POA: Diagnosis not present

## 2019-12-08 DIAGNOSIS — Z20828 Contact with and (suspected) exposure to other viral communicable diseases: Secondary | ICD-10-CM | POA: Diagnosis not present

## 2019-12-08 LAB — TROPONIN I (HIGH SENSITIVITY): Troponin I (High Sensitivity): <2 ng/L (ref <18)

## 2019-12-09 ENCOUNTER — Encounter (HOSPITAL_COMMUNITY): Payer: Self-pay

## 2019-12-09 ENCOUNTER — Other Ambulatory Visit: Payer: Self-pay

## 2019-12-09 ENCOUNTER — Ambulatory Visit (HOSPITAL_COMMUNITY)
Admission: EM | Admit: 2019-12-09 | Discharge: 2019-12-09 | Disposition: A | Discharge status: Home / Self Care | Payer: BC Managed Care – PPO | Attending: Internal Medicine | Admitting: Internal Medicine

## 2019-12-09 DIAGNOSIS — D649 Anemia, unspecified: Secondary | ICD-10-CM | POA: Insufficient documentation

## 2019-12-09 DIAGNOSIS — Z20822 Contact with and (suspected) exposure to covid-19: Secondary | ICD-10-CM | POA: Insufficient documentation

## 2019-12-09 DIAGNOSIS — R05 Cough: Secondary | ICD-10-CM | POA: Insufficient documentation

## 2019-12-09 DIAGNOSIS — R072 Precordial pain: Secondary | ICD-10-CM | POA: Diagnosis not present

## 2019-12-09 DIAGNOSIS — R0602 Shortness of breath: Secondary | ICD-10-CM | POA: Insufficient documentation

## 2019-12-09 DIAGNOSIS — Z87891 Personal history of nicotine dependence: Secondary | ICD-10-CM | POA: Diagnosis not present

## 2019-12-09 DIAGNOSIS — M940 Chondrocostal junction syndrome [Tietze]: Secondary | ICD-10-CM | POA: Diagnosis not present

## 2019-12-09 LAB — TSH: TSH: 1.471 u[IU]/mL (ref 0.350–4.500)

## 2019-12-09 LAB — VITAMIN D 25 HYDROXY (VIT D DEFICIENCY, FRACTURES): Vit D, 25-Hydroxy: 7.92 ng/mL — ABNORMAL LOW (ref 30–100)

## 2019-12-09 MED ORDER — KETOROLAC TROMETHAMINE 30 MG/ML IJ SOLN
30.0000 mg | Freq: Once | INTRAMUSCULAR | Status: AC
  Administered 2019-12-09: 30 mg via INTRAMUSCULAR

## 2019-12-09 MED ORDER — ALBUTEROL SULFATE HFA 108 (90 BASE) MCG/ACT IN AERS
INHALATION_SPRAY | RESPIRATORY_TRACT | Status: AC
  Filled 2019-12-09: qty 6.7

## 2019-12-09 MED ORDER — KETOROLAC TROMETHAMINE 30 MG/ML IJ SOLN
INTRAMUSCULAR | Status: AC
  Filled 2019-12-09: qty 1

## 2019-12-09 MED ORDER — ALBUTEROL SULFATE HFA 108 (90 BASE) MCG/ACT IN AERS
2.0000 | INHALATION_SPRAY | Freq: Once | RESPIRATORY_TRACT | Status: AC
  Administered 2019-12-09: 2 via RESPIRATORY_TRACT

## 2019-12-09 MED ORDER — IBUPROFEN 600 MG PO TABS
600.0000 mg | ORAL_TABLET | Freq: Four times a day (QID) | ORAL | 0 refills | Status: DC | PRN
Start: 2019-12-09 — End: 2021-07-07

## 2019-12-09 NOTE — ED Notes (Signed)
Covid test yesterday Negative

## 2019-12-09 NOTE — ED Triage Notes (Addendum)
Pt c/o SOB, 10/10 non radiating midsternal chest pressure, HAx2 days. Pt states it feels like a golf ball is stuck in her throat. Pt tachypneic. Skin color WNL. Lungs are clear.

## 2019-12-10 ENCOUNTER — Telehealth (HOSPITAL_COMMUNITY): Payer: Self-pay

## 2019-12-10 ENCOUNTER — Telehealth (HOSPITAL_COMMUNITY): Payer: Self-pay | Admitting: Internal Medicine

## 2019-12-10 LAB — SARS CORONAVIRUS 2 (TAT 6-24 HRS): SARS Coronavirus 2: NEGATIVE

## 2019-12-10 MED ORDER — VITAMIN D (ERGOCALCIFEROL) 1.25 MG (50000 UNIT) PO CAPS: 50000.0000 [IU] | ORAL_CAPSULE | ORAL | 0 refills | Status: DC

## 2019-12-10 MED ORDER — VITAMIN D (ERGOCALCIFEROL) 1.25 MG (50000 UNIT) PO CAPS
50000.0000 [IU] | ORAL_CAPSULE | ORAL | 0 refills | Status: DC
Start: 2019-12-10 — End: 2019-12-10

## 2019-12-10 NOTE — Telephone Encounter (Signed)
Patient's vitamin D level is 7.92.  Patient will need vitamin D replacement 50,000 units every week for 6 weeks.

## 2019-12-10 NOTE — ED Provider Notes (Addendum)
[facility]    CSN: [id number] Arrival date & time: 12/09/19  1634      History   Chief Complaint Chief Complaint  Patient presents with  . Shortness of Breath    HPI Susan Everett is a 29 y.o. female comes to urgent care with complaints of persistent shortness of breath, sharp precordial chest pain, nonproductive cough of 2 days duration.  Patient was evaluated in the emergency department a couple of days ago and myocardial infarction was ruled out.  Patient says the pain is sharp, severe-currently 10/10, no known relieving factors and nonradiating.  No fever or chills.  No loss of taste or smell.  No wheezing.  No dizziness, near syncope or syncopal episodes.  No trauma to the chest or heavy lifting.Marland Kitchen   HPI  Past Medical History:  Diagnosis Date  . Anemia   . Hx of chlamydia infection     Patient Active Problem List   Diagnosis Date Noted  . Indication for care in labor or delivery 12/26/2017  . Normal pregnancy in third trimester 10/14/2016  . SVD (spontaneous vaginal delivery) 10/14/2016  . Anemia affecting pregnancy in third trimester 08/05/2016    History reviewed. No pertinent surgical history.  OB History    Gravida  3   Para  2   Term  1   Preterm  1   AB  0   Living  2     SAB  0   TAB  0   Ectopic  0   Multiple  0   Live Births  2            Home Medications    Prior to Admission medications   Medication Sig Start Date End Date Taking? Authorizing Provider  ibuprofen (ADVIL) 600 MG tablet Take 1 tablet (600 mg total) by mouth every 6 (six) hours as needed. 12/09/19   Dawanda Mapel, Britta Mccreedy, MD    Family History No family history on file.  Social History Social History   Tobacco Use  . Smoking status: Former Smoker    Quit date: 07/22/2010    Years since quitting: 9.3  . Smokeless tobacco: Former Engineer, water Use Topics  . Alcohol use: No  . Drug use: No     Allergies   Patient has no known  allergies.   Review of Systems Review of Systems  Constitutional: Negative.   Respiratory: Positive for cough and shortness of breath. Negative for wheezing.   Cardiovascular: Positive for chest pain. Negative for palpitations and leg swelling.  Gastrointestinal: Negative.   Musculoskeletal: Positive for arthralgias.  Skin: Negative.   Neurological: Negative.      Physical Exam Triage Vital Signs ED Triage Vitals  Enc Vitals Group     BP 12/09/19 1716 (!) 140/91     Pulse Rate 12/09/19 1716 79     Resp 12/09/19 1716 (!) 26     Temp 12/09/19 1716 98.6 F (37 C)     Temp Source 12/09/19 1716 Oral     SpO2 12/09/19 1716 100 %     Weight 12/09/19 1717 220 lb (99.8 kg)     Height 12/09/19 1717 5\' 4"  (1.626 m)     Head Circumference --      Peak Flow --      Pain Score 12/09/19 1717 10     Pain Loc --      Pain Edu? --      Excl. in GC? --  No data found.  Updated Vital Signs BP (!) 140/91   Pulse 79   Temp 98.6 F (37 C) (Oral)   Resp (!) 26   Ht 5\' 4"  (1.626 m)   Wt 99.8 kg   SpO2 100%   BMI 37.76 kg/m   Visual Acuity Right Eye Distance:   Left Eye Distance:   Bilateral Distance:    Right Eye Near:   Left Eye Near:    Bilateral Near:     Physical Exam Vitals and nursing note reviewed.  Constitutional:      General: She is in acute distress.     Appearance: She is obese.  HENT:     Mouth/Throat:     Pharynx: No pharyngeal swelling or oropharyngeal exudate.  Cardiovascular:     Rate and Rhythm: Normal rate.  Pulmonary:     Effort: Pulmonary effort is normal.     Breath sounds: Normal breath sounds. No decreased breath sounds, wheezing or rhonchi.  Chest:     Chest wall: Tenderness present.     Comments: Point tenderness over the costochondral joints.  Point tenderness about the xiphisternal line. Musculoskeletal:     Cervical back: Normal range of motion.  Neurological:     Mental Status: She is alert.      UC Treatments / Results   Labs (all labs ordered are listed, but only abnormal results are displayed) Labs Reviewed  VITAMIN D 25 HYDROXY (VIT D DEFICIENCY, FRACTURES) - Abnormal; Notable for the following components:      Result Value   Vit D, 25-Hydroxy 7.92 (*)    All other components within normal limits  SARS CORONAVIRUS 2 (TAT 6-24 HRS)  TSH    EKG   Radiology No results found.  Procedures Procedures (including critical care time)  Medications Ordered in UC Medications  albuterol (VENTOLIN HFA) 108 (90 Base) MCG/ACT inhaler 2 puff (2 puffs Inhalation Given 12/09/19 1829)  ketorolac (TORADOL) 30 MG/ML injection 30 mg (30 mg Intramuscular Given 12/09/19 1937)    Initial Impression / Assessment and Plan / UC Course  I have reviewed the triage vital signs and the nursing notes.  Pertinent labs & imaging results that were available during my care of the patient were reviewed by me and considered in my medical decision making (see chart for details).     1.  Costochondritis: Toradol 30 mg IM x1 dose Trial of albuterol Ibuprofen 600 mg every 6 hours as needed Vitamin D level, TSH. Return precautions given Weight loss advised  Final Clinical Impressions(s) / UC Diagnoses   Final diagnoses:  SOB (shortness of breath)  Costochondritis   Discharge Instructions   None    ED Prescriptions    Medication Sig Dispense Auth. Provider   ibuprofen (ADVIL) 600 MG tablet Take 1 tablet (600 mg total) by mouth every 6 (six) hours as needed. 30 tablet Celene Pippins, Myrene Galas, MD     PDMP not reviewed this encounter.   Chase Picket, MD 12/10/19 1011    Chase Picket, MD 12/10/19 1012    Chase Picket, MD 12/10/19 1012

## 2020-02-18 DIAGNOSIS — Z20822 Contact with and (suspected) exposure to covid-19: Secondary | ICD-10-CM | POA: Diagnosis not present

## 2020-04-21 ENCOUNTER — Ambulatory Visit (HOSPITAL_COMMUNITY)
Admission: EM | Admit: 2020-04-21 | Discharge: 2020-04-21 | Disposition: A | Discharge status: Home / Self Care | Payer: BC Managed Care – PPO | Attending: Family Medicine | Admitting: Family Medicine

## 2020-04-21 ENCOUNTER — Encounter (HOSPITAL_COMMUNITY): Payer: Self-pay | Admitting: *Deleted

## 2020-04-21 ENCOUNTER — Other Ambulatory Visit: Payer: Self-pay

## 2020-04-21 DIAGNOSIS — M940 Chondrocostal junction syndrome [Tietze]: Secondary | ICD-10-CM | POA: Diagnosis not present

## 2020-04-21 DIAGNOSIS — Z20822 Contact with and (suspected) exposure to covid-19: Secondary | ICD-10-CM | POA: Insufficient documentation

## 2020-04-21 DIAGNOSIS — Z87891 Personal history of nicotine dependence: Secondary | ICD-10-CM | POA: Insufficient documentation

## 2020-04-21 DIAGNOSIS — J069 Acute upper respiratory infection, unspecified: Secondary | ICD-10-CM | POA: Insufficient documentation

## 2020-04-21 DIAGNOSIS — R079 Chest pain, unspecified: Secondary | ICD-10-CM | POA: Diagnosis not present

## 2020-04-21 DIAGNOSIS — M5412 Radiculopathy, cervical region: Secondary | ICD-10-CM | POA: Insufficient documentation

## 2020-04-21 MED ORDER — METHYLPREDNISOLONE 4 MG PO TBPK
ORAL_TABLET | ORAL | 0 refills | Status: DC
Start: 2020-04-21 — End: 2021-07-07

## 2020-04-21 NOTE — ED Provider Notes (Addendum)
[facility]    CSN: [id number] Arrival date & time: 04/21/20  1329      History   Chief Complaint Chief Complaint  Patient presents with  . Chest Pain  . Shortness of Breath    HPI Susan Everett is a 29 y.o. female.   HPI  28 year old female who presents today with chest pain and URI symptoms.  Patient states that she started sneezing significantly several days ago and then developed acute chest pain in the center of her chest.  Pain is also felt in her right shoulder and upper back.  She states that she has occasional shortness of breath particularly during sneezing fits.  She also admits to vague URI symptoms such as slight sinus pressure in the maxillary area.  She has 2 children, ages 2 and 3, who are both in daycare and are both sick with URI symptoms and fevers.  She also received the first dose of her Covid vaccine about a week ago.  She has not taken anything to help with symptoms. Patient has notable history of costochondritis and cervical radiculopathy.  Urgent care EKG and chest x-ray were done.  Results unremarkable.  Past Medical History:  Diagnosis Date  . Anemia   . Hx of chlamydia infection     Patient Active Problem List   Diagnosis Date Noted  . Indication for care in labor or delivery 12/26/2017  . Normal pregnancy in third trimester 10/14/2016  . SVD (spontaneous vaginal delivery) 10/14/2016  . Anemia affecting pregnancy in third trimester 08/05/2016    History reviewed. No pertinent surgical history.  OB History    Gravida  3   Para  2   Term  1   Preterm  1   AB  0   Living  2     SAB  0   TAB  0   Ectopic  0   Multiple  0   Live Births  2            Home Medications    Prior to Admission medications   Medication Sig Start Date End Date Taking? Authorizing Provider  ibuprofen (ADVIL) 600 MG tablet Take 1 tablet (600 mg total) by mouth every 6 (six) hours as needed. 12/09/19   Merrilee Jansky, MD    methylPREDNISolone (MEDROL DOSEPAK) 4 MG TBPK tablet tad 04/21/20   Eustace Moore, MD    Family History Family History  Problem Relation Age of Onset  . Hypertension Mother   . Bronchitis Mother   . Healthy Father     Social History Social History   Tobacco Use  . Smoking status: Former Smoker    Quit date: 07/22/2010    Years since quitting: 9.7  . Smokeless tobacco: Former Clinical biochemist  . Vaping Use: Never used  Substance Use Topics  . Alcohol use: No  . Drug use: No     Allergies   Patient has no known allergies.   Review of Systems Review of Systems See HPI.  Physical Exam Triage Vital Signs ED Triage Vitals  Enc Vitals Group     BP 04/21/20 1352 134/67     Pulse Rate 04/21/20 1352 89     Resp 04/21/20 1352 20     Temp 04/21/20 1352 97.9 F (36.6 C)     Temp Source 04/21/20 1352 Oral     SpO2 04/21/20 1352 100 %     Weight --      Height --  Head Circumference --      Peak Flow --      Pain Score 04/21/20 1354 8     Pain Loc --      Pain Edu? --      Excl. in GC? --    No data found.  Updated Vital Signs BP 134/67 (BP Location: Left Arm)   Pulse 89   Temp 97.9 F (36.6 C) (Oral)   Resp 20   SpO2 100%   Visual Acuity Right Eye Distance:   Left Eye Distance:   Bilateral Distance:    Right Eye Near:   Left Eye Near:    Bilateral Near:     Physical Exam Constitutional:      General: She is not in acute distress.    Appearance: She is well-developed.  HENT:     Head: Normocephalic and atraumatic.     Comments: Patient had some slight tenderness in the maxillary area Eyes:     Conjunctiva/sclera: Conjunctivae normal.     Pupils: Pupils are equal, round, and reactive to light.  Cardiovascular:     Rate and Rhythm: Normal rate and regular rhythm.  Pulmonary:     Effort: Pulmonary effort is normal. No respiratory distress.  Chest:     Chest wall: Tenderness present. No mass, deformity, crepitus or edema. There is no  dullness to percussion.  Abdominal:     General: There is no distension.     Palpations: Abdomen is soft.  Musculoskeletal:        General: Normal range of motion.     Cervical back: Normal range of motion.     Comments: Patient had notable tenderness along bilateral trapezius muscles.  Tenderness also noted in the anterior portion of the right shoulder.  Paresthesia elicited down the right forearm and hand. Strength tested 4 out of 5 right hand.  5 out of 5 left hand  Lymphadenopathy:     Cervical: Cervical adenopathy present.  Skin:    General: Skin is warm and dry.  Neurological:     General: No focal deficit present.     Mental Status: She is alert and oriented to person, place, and time.      UC Treatments / Results  Labs (all labs ordered are listed, but only abnormal results are displayed) Labs Reviewed  SARS CORONAVIRUS 2 (TAT 6-24 HRS)    EKG Normal sinus rhythm.  Radiology No results found.  Procedures Procedures (including critical care time)  Medications Ordered in UC Medications - No data to display  Initial Impression / Assessment and Plan / UC Course  I have reviewed the triage vital signs and the nursing notes.  Pertinent labs & imaging results that were available during my care of the patient were reviewed by me and considered in my medical decision making (see chart for details).     EKG done and showed normal sinus rhythm.  Chest pain elicited upon palpation.  Most likely costochondritis flare due to heavy sneezing and cough.  Will administer short course dose of steroids. URI symptoms most likely viral will be treated with conservative measures.  Rest, Flonase, proper hydration. Patient has been seen in the past by urgent care for a bilateral cervical radiculopathy.  Patient states that she is under high amounts of stress.  Proper ergonomics discussed with patient.  Short course dose of steroids being administered for costochondritis will also  assist with this problem.  If problem persist patient instructed to come back.  PT  may be needed in the future. Final Clinical Impressions(s) / UC Diagnoses   Final diagnoses:  Acute upper respiratory infection  Chest pain, unspecified type  Cervical radicular pain     Discharge Instructions     Take the steroids - dosepak as directed Rest Push fluids Return if not better in a few days    ED Prescriptions    Medication Sig Dispense Auth. Provider   methylPREDNISolone (MEDROL DOSEPAK) 4 MG TBPK tablet tad 21 tablet Eustace Moore, MD     PDMP not reviewed this encounter.   Eustace Moore, MD 04/21/20 1520    Eustace Moore, MD 04/21/20 [phone]

## 2020-04-21 NOTE — ED Triage Notes (Signed)
Patient in with complaints of chest pain to the middle/left side of chest x 1 week. Patient states that pain radiates down right arm. Patient states that SOB started a week ago and headache that has been off and on. Patient has not been COVID tested recently. 1st vaccine was received a week ago.

## 2020-04-21 NOTE — Discharge Instructions (Signed)
Take the steroids - dosepak as directed Rest Push fluids Return if not better in a few days

## 2020-04-22 LAB — SARS CORONAVIRUS 2 (TAT 6-24 HRS): SARS Coronavirus 2: NEGATIVE

## 2020-06-12 DIAGNOSIS — Z20822 Contact with and (suspected) exposure to covid-19: Secondary | ICD-10-CM | POA: Diagnosis not present

## 2020-07-07 ENCOUNTER — Telehealth: Payer: Self-pay | Admitting: General Practice

## 2020-07-07 NOTE — Telephone Encounter (Signed)
Tried to call pt because she is on the wait list for Dr Veto Kemps and wanted to see if she would like to schedule appt. His schedule opens on 08/21/20 for scheduling.

## 2020-08-02 DIAGNOSIS — Z0000 Encounter for general adult medical examination without abnormal findings: Secondary | ICD-10-CM | POA: Diagnosis not present

## 2020-08-21 ENCOUNTER — Ambulatory Visit: Payer: BC Managed Care – PPO | Admitting: Family Medicine

## 2020-09-28 DIAGNOSIS — Z01419 Encounter for gynecological examination (general) (routine) without abnormal findings: Secondary | ICD-10-CM | POA: Diagnosis not present

## 2020-09-28 DIAGNOSIS — Z1151 Encounter for screening for human papillomavirus (HPV): Secondary | ICD-10-CM | POA: Diagnosis not present

## 2020-09-28 DIAGNOSIS — Z124 Encounter for screening for malignant neoplasm of cervix: Secondary | ICD-10-CM | POA: Diagnosis not present

## 2020-09-28 DIAGNOSIS — Z130 Encounter for screening for diseases of the blood and blood-forming organs and certain disorders involving the immune mechanism: Secondary | ICD-10-CM | POA: Diagnosis not present

## 2020-10-30 DIAGNOSIS — Z6839 Body mass index (BMI) 39.0-39.9, adult: Secondary | ICD-10-CM | POA: Diagnosis not present

## 2020-12-26 DIAGNOSIS — Z6839 Body mass index (BMI) 39.0-39.9, adult: Secondary | ICD-10-CM | POA: Diagnosis not present

## 2021-07-07 ENCOUNTER — Other Ambulatory Visit: Payer: Self-pay

## 2021-07-07 ENCOUNTER — Emergency Department (HOSPITAL_COMMUNITY)
Admission: EM | Admit: 2021-07-07 | Discharge: 2021-07-08 | Disposition: A | Discharge status: Home / Self Care | Payer: BC Managed Care – PPO | Attending: Emergency Medicine | Admitting: Emergency Medicine

## 2021-07-07 ENCOUNTER — Emergency Department (HOSPITAL_COMMUNITY): Payer: BC Managed Care – PPO

## 2021-07-07 ENCOUNTER — Encounter (HOSPITAL_COMMUNITY): Payer: Self-pay

## 2021-07-07 ENCOUNTER — Ambulatory Visit (HOSPITAL_COMMUNITY)
Admission: EM | Admit: 2021-07-07 | Discharge: 2021-07-07 | Disposition: A | Discharge status: Home / Self Care | Payer: BC Managed Care – PPO

## 2021-07-07 DIAGNOSIS — J029 Acute pharyngitis, unspecified: Secondary | ICD-10-CM

## 2021-07-07 DIAGNOSIS — M542 Cervicalgia: Secondary | ICD-10-CM | POA: Insufficient documentation

## 2021-07-07 DIAGNOSIS — R070 Pain in throat: Secondary | ICD-10-CM | POA: Diagnosis not present

## 2021-07-07 DIAGNOSIS — Z87891 Personal history of nicotine dependence: Secondary | ICD-10-CM | POA: Insufficient documentation

## 2021-07-07 DIAGNOSIS — R519 Headache, unspecified: Secondary | ICD-10-CM | POA: Diagnosis not present

## 2021-07-07 DIAGNOSIS — M436 Torticollis: Secondary | ICD-10-CM | POA: Diagnosis not present

## 2021-07-07 DIAGNOSIS — R059 Cough, unspecified: Secondary | ICD-10-CM | POA: Diagnosis not present

## 2021-07-07 DIAGNOSIS — Z20822 Contact with and (suspected) exposure to covid-19: Secondary | ICD-10-CM | POA: Diagnosis not present

## 2021-07-07 LAB — CBC WITH DIFFERENTIAL/PLATELET
Abs Immature Granulocytes: 0 10*3/uL (ref 0.00–0.07)
Basophils Absolute: 0.1 10*3/uL (ref 0.0–0.1)
Basophils Relative: 1 %
Eosinophils Absolute: 0.1 10*3/uL (ref 0.0–0.5)
Eosinophils Relative: 1 %
HCT: 32.4 % — ABNORMAL LOW (ref 36.0–46.0)
Hemoglobin: 10.6 g/dL — ABNORMAL LOW (ref 12.0–15.0)
Lymphocytes Relative: 32 %
Lymphs Abs: 2.7 10*3/uL (ref 0.7–4.0)
MCH: 29.6 pg (ref 26.0–34.0)
MCHC: 32.7 g/dL (ref 30.0–36.0)
MCV: 90.5 fL (ref 80.0–100.0)
Monocytes Absolute: 0.6 10*3/uL (ref 0.1–1.0)
Monocytes Relative: 7 %
Neutro Abs: 4.9 10*3/uL (ref 1.7–7.7)
Neutrophils Relative %: 59 %
Platelets: 240 10*3/uL (ref 150–400)
RBC: 3.58 MIL/uL — ABNORMAL LOW (ref 3.87–5.11)
RDW: 13.7 % (ref 11.5–15.5)
WBC: 8.3 10*3/uL (ref 4.0–10.5)
nRBC: 0 % (ref 0.0–0.2)
nRBC: 0 /100 WBC

## 2021-07-07 LAB — BASIC METABOLIC PANEL
Anion gap: 6 (ref 5–15)
BUN: 11 mg/dL (ref 6–20)
CO2: 24 mmol/L (ref 22–32)
Calcium: 8.7 mg/dL — ABNORMAL LOW (ref 8.9–10.3)
Chloride: 107 mmol/L (ref 98–111)
Creatinine, Ser: 0.8 mg/dL (ref 0.44–1.00)
GFR, Estimated: >60 mL/min (ref >60)
Glucose, Bld: 94 mg/dL (ref 70–99)
Potassium: 4.1 mmol/L (ref 3.5–5.1)
Sodium: 137 mmol/L (ref 135–145)

## 2021-07-07 LAB — GROUP A STREP BY PCR: Group A Strep by PCR: NOT DETECTED

## 2021-07-07 MED ORDER — KETOROLAC TROMETHAMINE 30 MG/ML IJ SOLN
30.0000 mg | Freq: Once | INTRAMUSCULAR | Status: AC
  Administered 2021-07-07: 30 mg via INTRAVENOUS
  Filled 2021-07-07: qty 1

## 2021-07-07 MED ORDER — SODIUM CHLORIDE 0.9 % IV BOLUS
1000.0000 mL | Freq: Once | INTRAVENOUS | Status: AC
  Administered 2021-07-07: 1000 mL via INTRAVENOUS

## 2021-07-07 MED ORDER — ONDANSETRON HCL 4 MG/2ML IJ SOLN
4.0000 mg | Freq: Once | INTRAMUSCULAR | Status: AC
  Administered 2021-07-07: 4 mg via INTRAVENOUS
  Filled 2021-07-07: qty 2

## 2021-07-07 NOTE — ED Triage Notes (Signed)
Pt arrived POV from home c/o a headache, sore throat and then neck pain that started 3 days ago. Pt went to urgent care on Tuesday and was tested for COVID, flu and strep. All 3 were negative but the symptoms have gotten worse.

## 2021-07-07 NOTE — Discharge Instructions (Signed)
Given the severity of pain I think you should go to the ER for evaluation.

## 2021-07-07 NOTE — ED Provider Notes (Signed)
Emergency Medicine Provider Triage Evaluation Note  Susan Everett , a 30 y.o. female  was evaluated in triage.  Pt complains of neck pain,  Pt reports she had viral illness and was seen on Tuesday.  Pt reports seen today and sent to ED   Review of Systems  Positive: fever Negative:   Physical Exam  BP (!) 156/91 (BP Location: Right Arm)    Pulse 72    Temp 99.5 F (37.5 C) (Oral)    Resp 18    Ht 5\' 4"  (1.626 m)    Wt 86.2 kg    SpO2 100%    BMI 32.61 kg/m  Gen:   Awake, no distress   Resp:  Normal effort  MSK:   Moves extremities without difficulty  Other:    Medical Decision Making  Medically screening exam initiated at 7:54 PM.  Appropriate orders placed.  Orly was informed that the remainder of the evaluation will be completed by another provider, this initial triage assessment does not replace that evaluation, and the importance of remaining in the ED until their evaluation is complete.     [place], [place] 07/07/21 1958    07/09/21, MD 07/11/21 1326

## 2021-07-07 NOTE — ED Provider Notes (Signed)
[facility] EMERGENCY DEPARTMENT Provider Note   CSN: [id number] Arrival date & time: 07/07/21  1836     History Chief Complaint  Patient presents with   Headache   Sore Throat   Neck Pain    Susan Everett is a 30 y.o. female.  Susan Everett is a 30 y.o. female with a history of anemia, who presents from urgent care for evaluation of 1 week of viral URI symptoms with associated headache and neck pain.  Patient reports her symptoms started 1 week ago when she developed sore throat, headache and cough.  She reports symptoms have been persistent and not improving over the past week.  She was initially seen at an outside urgent care on Tuesday at that time had negative rapid flu, COVID and strep.  She is continue to treat her symptoms supportively.  She returned to urgent care today because she has had continued cough, sore throat, and headache and is now having posterior neck pain.  She reports neck pain has been present for 2 or 3 days and she is able to move her neck with some discomfort.  Per urgent care evaluation patient did not have meningeal signs but had discomfort with movement of the neck, and so she was sent to the ED for further evaluation.  They did not repeat any viral testing today.  Patient reports she has had chills with the symptoms but has not had any documented fevers at home.  She has not had any confusion or altered mental status.  Reports she has occasionally had some dizziness.  No visual changes, numbness, weakness or tingling.  She has been using TheraFlu over-the-counter with some improvement.  Patient reports nausea and decreased oral intake but no vomiting, abdominal pain or diarrhea.  Unknown sick contacts.  The history is provided by the patient and medical records.      Past Medical History:  Diagnosis Date   Anemia    Hx of chlamydia infection     Patient Active Problem List   Diagnosis Date Noted   Indication for care in labor or  delivery 12/26/2017   Normal pregnancy in third trimester 10/14/2016   SVD (spontaneous vaginal delivery) 10/14/2016   Anemia affecting pregnancy in third trimester 08/05/2016    History reviewed. No pertinent surgical history.   OB History     Gravida  3   Para  2   Term  1   Preterm  1   AB  0   Living  2      SAB  0   IAB  0   Ectopic  0   Multiple  0   Live Births  2           Family History  Problem Relation Age of Onset   Hypertension Mother    Bronchitis Mother    Healthy Father     Social History   Tobacco Use   Smoking status: Former    Types: Cigarettes    Quit date: 07/22/2010    Years since quitting: 10.9   Smokeless tobacco: Former  Building services engineer Use: Never used  Substance Use Topics   Alcohol use: No   Drug use: Never    Home Medications Prior to Admission medications   Medication Sig Start Date End Date Taking? Authorizing Provider  levonorgestrel (MIRENA) 20 MCG/DAY IUD 1 each by Intrauterine route once.    [provider]    Allergies  Patient has no known allergies.  Review of Systems   Review of Systems  Constitutional:  Positive for appetite change and chills. Negative for fever.  HENT:  Positive for sore throat. Negative for congestion, ear pain and rhinorrhea.   Respiratory:  Positive for cough. Negative for shortness of breath.   Cardiovascular:  Negative for chest pain.  Gastrointestinal:  Positive for nausea. Negative for abdominal pain, diarrhea and vomiting.  Genitourinary:  Negative for dysuria.  Musculoskeletal:  Negative for back pain and myalgias.  Skin:  Negative for color change and rash.  Neurological:  Positive for dizziness and headaches. Negative for seizures, syncope, facial asymmetry, speech difficulty, weakness, light-headedness and numbness.  Psychiatric/Behavioral:  Negative for confusion.   All other systems reviewed and are negative.  Physical Exam Updated Vital Signs BP  (!) 156/91 (BP Location: Right Arm)    Pulse 72    Temp 99.5 F (37.5 C) (Oral)    Resp 18    Ht 5\' 4"  (1.626 m)    Wt 86.2 kg    SpO2 100%    BMI 32.61 kg/m   Physical Exam Vitals and nursing note reviewed.  Constitutional:      General: She is not in acute distress.    Appearance: Normal appearance. She is well-developed. She is not diaphoretic.     Comments: Alert, oriented, well-appearing, nontoxic  HENT:     Head: Normocephalic and atraumatic.     Right Ear: Tympanic membrane and ear canal normal.     Left Ear: Tympanic membrane and ear canal normal.     Nose: Congestion present. No rhinorrhea.     Mouth/Throat:     Mouth: Mucous membranes are moist.     Pharynx: Oropharynx is clear. Posterior oropharyngeal erythema present.     Comments: Posterior oropharynx clear and mucous membranes moist, there is mild erythema but no edema or tonsillar exudates, uvula midline, normal phonation, no trismus, tolerating secretions without difficulty. Eyes:     General:        Right eye: No discharge.        Left eye: No discharge.     Pupils: Pupils are equal, round, and reactive to light.  Neck:     Meningeal: Brudzinski's sign and Kernig's sign absent.     Comments: Patient is able to move the neck in all directions with mild discomfort, negative Kernig's and Brudzinski's sign Cardiovascular:     Rate and Rhythm: Normal rate and regular rhythm.     Pulses: Normal pulses.     Heart sounds: Normal heart sounds. No murmur heard.   No friction rub. No gallop.  Pulmonary:     Effort: Pulmonary effort is normal. No respiratory distress.     Breath sounds: Normal breath sounds. No wheezing or rales.     Comments: Respirations equal and unlabored, patient able to speak in full sentences, lungs clear to auscultation bilaterally  Abdominal:     General: Bowel sounds are normal. There is no distension.     Palpations: Abdomen is soft. There is no mass.     Tenderness: There is no abdominal  tenderness. There is no guarding.     Comments: Abdomen soft, nondistended, nontender to palpation in all quadrants without guarding or peritoneal signs  Musculoskeletal:        General: No deformity.     Cervical back: Neck supple.  Skin:    General: Skin is warm and dry.     Capillary Refill: Capillary refill  takes less than 2 seconds.  Neurological:     Mental Status: She is alert and oriented to person, place, and time.     GCS: GCS eye subscore is 4. GCS verbal subscore is 5. GCS motor subscore is 6.     Coordination: Coordination normal.     Comments: Speech is clear, able to follow commands CN III-XII intact Normal strength in upper and lower extremities bilaterally including dorsiflexion and plantar flexion, strong and equal grip strength Sensation normal to light and sharp touch Moves extremities without ataxia, coordination intact  Psychiatric:        Mood and Affect: Mood normal.        Behavior: Behavior normal.    ED Results / Procedures / Treatments   Labs (all labs ordered are listed, but only abnormal results are displayed) Labs Reviewed  CBC WITH DIFFERENTIAL/PLATELET - Abnormal; Notable for the following components:      Result Value   RBC 3.58 (*)    Hemoglobin 10.6 (*)    HCT 32.4 (*)    All other components within normal limits  BASIC METABOLIC PANEL - Abnormal; Notable for the following components:   Calcium 8.7 (*)    All other components within normal limits  URINALYSIS, ROUTINE W REFLEX MICROSCOPIC - Abnormal; Notable for the following components:   Specific Gravity, Urine >1.030 (*)    All other components within normal limits  RESP PANEL BY RT-PCR (FLU A&B, COVID) [id number]  RESPIRATORY PANEL BY PCR  GROUP A STREP BY PCR  POC URINE PREG, ED    EKG None  Radiology DG Chest Port 1 View  Result Date: 07/07/2021 CLINICAL DATA:  Cough and neck pain. EXAM: PORTABLE CHEST 1 VIEW COMPARISON:  Dec 07, 2019 FINDINGS: The heart size and mediastinal  contours are within normal limits. Both lungs are clear. The visualized skeletal structures are unremarkable. IMPRESSION: No active disease. Electronically Signed   By: Aram Candela M.D.   On: 07/07/2021 23:03    Procedures Procedures   Medications Ordered in ED Medications  sodium chloride 0.9 % bolus 1,000 mL (0 mLs Intravenous Stopped 07/08/21 0055)  ondansetron (ZOFRAN) injection 4 mg (4 mg Intravenous Given 07/07/21 2249)  ketorolac (TORADOL) 30 MG/ML injection 30 mg (30 mg Intravenous Given 07/07/21 2251)  acetaminophen (TYLENOL) tablet 1,000 mg (1,000 mg Oral Given 07/08/21 0145)  dexamethasone (DECADRON) injection 10 mg (10 mg Intravenous Given 07/08/21 0146)  lidocaine (XYLOCAINE) 2 % viscous mouth solution 15 mL (15 mLs Mouth/Throat Given 07/08/21 0145)    ED Course  I have reviewed the triage vital signs and the nursing notes.  Pertinent labs & imaging results that were available during my care of the patient were reviewed by me and considered in my medical decision making (see chart for details).    MDM Rules/Calculators/A&P                          30 year old female sent from urgent care for further evaluation.  She has been sick with URI symptoms for 1 week.  When symptoms initially started she had negative rapid viral testing and strep.  Return to urgent care today due to continued URI symptoms including intermittent headache and some posterior neck pain.  At urgent care patient did not have true meningeal signs but had discomfort with neck movement she was sent to the ED with concern for meningitis.  On arrival patient is very well-appearing, afebrile with normal vitals  aside from mild hypertension.  Patient has no meningeal signs on exam does have some discomfort over the posterior neck but is able to move the neck in all directions.  Patient does have lymphadenopathy and also some posterior chain lymphadenopathy that could be contributing to neck pain.  Lungs are  clear, abdomen is soft.  Will get repeat viral testing, basic labs ordered from triage, will also get chest x-ray and urinalysis.  We will treat symptoms supportively.  Have very low suspicion for meningitis and do not feel the patient needs LP at this time.  I have independently ordered, reviewed and interpreted all labs and imaging: CBC: No leukocytosis, stable hemoglobin BMP: No significant electrolyte derangements, normal renal function COVID/flu: neg Extended respiratory viral panel is negative as well Strep: Negative UA: No signs of infection Pregnancy: Negative  Chest x-ray: No active cardiopulmonary disease.  After additional medications patient reports improvement in headache and symptoms.  I had a shared decision making with patient, overall have very low suspicion for meningitis, discussed that an LP is the only way to ultimately rule this out but at this time patient is feeling much better and does not want to proceed with LP.  Suspect symptoms may be due to mono but given that symptoms have only been present for 1 week it is too early for a Monospot test to be positive so we will hold off on adding this.  Discussed continued supportive treatment, PCP follow-up and return precautions.  Patient expresses understanding and agreement.  Discharged home in good condition.   Final Clinical Impression(s) / ED Diagnoses Final diagnoses:  Sore throat  Acute nonintractable headache, unspecified headache type  Neck pain    Rx / DC Orders ED Discharge Orders     None        Legrand Rams 07/08/21 0335    Tilden Fossa, MD 07/08/21 [phone]

## 2021-07-07 NOTE — ED Triage Notes (Signed)
Pt reports neck pain and burning sensation in the back of the neck, sore throat and headache x 1 week. States feels like "golf ball in the throat".

## 2021-07-07 NOTE — ED Provider Notes (Signed)
[facility]    CSN: [id number] Arrival date & time: 07/07/21  1716      History   Chief Complaint Chief Complaint  Patient presents with   Sore Throat        Neck Pain    HPI Susan Everett is a 30 y.o. female.   Patient presents today with a 3-day history of worsening headache and neck pain.  Reports that initially she had URI symptoms including cough, congestion, sore throat.  This began on 06/30/2021 and she began taking over-the-counter medications including TheraFlu without improvement of symptoms.  She was seen at a different urgent care on 07/03/2021 at which point she tested negative for flu, COVID, strep was encouraged use over-the-counter medication.  She reports that sore throat pain has continued to worsen over the past several days she has developed a 10 out of 10 frontal headache as well as a 10 out of 10 neck stiffness/pain.  She describes pain as localized to the midline cervical spine described as a stiffness and burning sensation.  She does believe that she is up-to-date on her age-appropriate immunizations but is unsure about meningococcal vaccination.  She denies any recent antibiotic use.  She denies history of immunosuppression including diabetes.  She has been taking over-the-counter analgesics with no improvement of symptoms.  She does not believe that she has had a fever but has been taking medication on a regular basis every 4-6 hours.  She reports pain is severe and interfering with her ability to sleep at night.   Past Medical History:  Diagnosis Date   Anemia    Hx of chlamydia infection     Patient Active Problem List   Diagnosis Date Noted   Indication for care in labor or delivery 12/26/2017   Normal pregnancy in third trimester 10/14/2016   SVD (spontaneous vaginal delivery) 10/14/2016   Anemia affecting pregnancy in third trimester 08/05/2016    History reviewed. No pertinent surgical history.  OB History     Gravida  3    Para  2   Term  1   Preterm  1   AB  0   Living  2      SAB  0   IAB  0   Ectopic  0   Multiple  0   Live Births  2            Home Medications    Prior to Admission medications   Medication Sig Start Date End Date Taking? Authorizing Provider  levonorgestrel (MIRENA) 20 MCG/DAY IUD 1 each by Intrauterine route once.   Yes [provider]    Family History Family History  Problem Relation Age of Onset   Hypertension Mother    Bronchitis Mother    Healthy Father     Social History Social History   Tobacco Use   Smoking status: Former    Types: Cigarettes    Quit date: 07/22/2010    Years since quitting: 10.9   Smokeless tobacco: Former  Building services engineer Use: Never used  Substance Use Topics   Alcohol use: No   Drug use: Never     Allergies   Patient has no known allergies.   Review of Systems Review of Systems  Constitutional:  Positive for activity change, appetite change and fatigue. Negative for fever.  HENT:  Positive for congestion and sore throat. Negative for sinus pressure and sneezing.   Respiratory:  Positive for cough. Negative for shortness  of breath.   Cardiovascular:  Negative for chest pain.  Gastrointestinal:  Negative for abdominal pain, diarrhea, nausea and vomiting.  Musculoskeletal:  Positive for myalgias, neck pain and neck stiffness.  Neurological:  Positive for headaches. Negative for dizziness, weakness and light-headedness.    Physical Exam Triage Vital Signs ED Triage Vitals  Enc Vitals Group     BP 07/07/21 1802 (!) 148/93     Pulse Rate 07/07/21 1802 76     Resp 07/07/21 1802 18     Temp 07/07/21 1802 99.3 F (37.4 C)     Temp Source 07/07/21 1802 Oral     SpO2 07/07/21 1802 99 %     Weight --      Height --      Head Circumference --      Peak Flow --      Pain Score 07/07/21 1800 8     Pain Loc --      Pain Edu? --      Excl. in GC? --    No data found.  Updated Vital Signs BP (!)  148/93 (BP Location: Left Arm)    Pulse 76    Temp 99.3 F (37.4 C) (Oral)    Resp 18    SpO2 99%    Breastfeeding No   Visual Acuity Right Eye Distance:   Left Eye Distance:   Bilateral Distance:    Right Eye Near:   Left Eye Near:    Bilateral Near:     Physical Exam Vitals reviewed.  Constitutional:      General: She is awake. She is not in acute distress.    Appearance: Normal appearance. She is well-developed. She is ill-appearing.     Comments: Very pleasant female appears stated age in no acute distress obviously uncomfortable  HENT:     Head: Normocephalic and atraumatic.     Right Ear: Tympanic membrane, ear canal and external ear normal. Tympanic membrane is not erythematous or bulging.     Left Ear: Tympanic membrane, ear canal and external ear normal. Tympanic membrane is not erythematous or bulging.     Nose:     Right Sinus: No maxillary sinus tenderness or frontal sinus tenderness.     Left Sinus: No maxillary sinus tenderness or frontal sinus tenderness.     Mouth/Throat:     Pharynx: Uvula midline. Posterior oropharyngeal erythema present. No oropharyngeal exudate.     Tonsils: No tonsillar exudate or tonsillar abscesses.  Eyes:     Extraocular Movements: Extraocular movements intact.  Neck:     Meningeal: Brudzinski's sign absent.     Comments: Patient had significant worsening of pain with passive flexion of neck but did not have leg movement  Cardiovascular:     Rate and Rhythm: Normal rate and regular rhythm.     Heart sounds: Normal heart sounds, S1 normal and S2 normal. Heart sounds not distant. No murmur heard. Pulmonary:     Effort: Pulmonary effort is normal.     Breath sounds: Normal breath sounds. No wheezing, rhonchi or rales.     Comments: Clear to auscultation bilaterally Neurological:     General: No focal deficit present.     Cranial Nerves: Cranial nerves 2-12 are intact.     Motor: Motor function is intact.  Psychiatric:        Behavior:  Behavior is cooperative.     UC Treatments / Results  Labs (all labs ordered are listed, but only abnormal results are  displayed) Labs Reviewed - No data to display  EKG   Radiology No results found.  Procedures Procedures (including critical care time)  Medications Ordered in UC Medications - No data to display  Initial Impression / Assessment and Plan / UC Course  I have reviewed the triage vital signs and the nursing notes.  Pertinent labs & imaging results that were available during my care of the patient were reviewed by me and considered in my medical decision making (see chart for details).     No indication for viral testing given patient has been symptomatic for more than a week and symptoms have recently worsened.  Discussed that given 10 out of 10 headache and neck pain with associated neck stiffness the safest thing would be to go to the emergency room for stat labs and potential imaging/procedures.  Patient was initially hesitant and we discussed potentially starting medication such as antibiotics and having her follow-up in 12-24 hours for reevaluation, however, given worsening of symptoms and severity of pain patient ultimately agreed to go to the emergency room.  She will go directly to the emergency room at [facility] for further evaluation and management following visit.  Her vital signs are stable and she is safe for private transport.  Final Clinical Impressions(s) / UC Diagnoses   Final diagnoses:  Neck pain  Severe headache  Neck stiffness  Sore throat     Discharge Instructions      Given the severity of pain I think you should go to the ER for evaluation.      ED Prescriptions   None    PDMP not reviewed this encounter.   Jeani Hawking, PA-C 07/07/21 1859

## 2021-07-08 LAB — POC URINE PREG, ED: Preg Test, Ur: NEGATIVE

## 2021-07-08 LAB — RESPIRATORY PANEL BY PCR

## 2021-07-08 LAB — URINALYSIS, ROUTINE W REFLEX MICROSCOPIC
Bilirubin Urine: NEGATIVE
Glucose, UA: NEGATIVE mg/dL
Hgb urine dipstick: NEGATIVE
Ketones, ur: NEGATIVE mg/dL
Leukocytes,Ua: NEGATIVE
Nitrite: NEGATIVE
Protein, ur: NEGATIVE mg/dL
Specific Gravity, Urine: >1.03 — ABNORMAL HIGH (ref 1.005–1.030)
pH: 6 (ref 5.0–8.0)

## 2021-07-08 LAB — RESP PANEL BY RT-PCR (FLU A&B, COVID) ARPGX2
Influenza A by PCR: NEGATIVE
Influenza B by PCR: NEGATIVE
SARS Coronavirus 2 by RT PCR: NEGATIVE

## 2021-07-08 MED ORDER — ACETAMINOPHEN 500 MG PO TABS
1000.0000 mg | ORAL_TABLET | Freq: Once | ORAL | Status: AC
  Administered 2021-07-08: 02:00:00 1000 mg via ORAL
  Filled 2021-07-08: qty 2

## 2021-07-08 MED ORDER — METHOCARBAMOL 500 MG PO TABS: 500.0000 mg | ORAL_TABLET | Freq: Once | ORAL | Status: DC

## 2021-07-08 MED ORDER — DIPHENHYDRAMINE HCL 50 MG/ML IJ SOLN: 25.0000 mg | Freq: Once | INTRAMUSCULAR | Status: DC

## 2021-07-08 MED ORDER — DEXAMETHASONE SODIUM PHOSPHATE 10 MG/ML IJ SOLN
10.0000 mg | Freq: Once | INTRAMUSCULAR | Status: AC
  Administered 2021-07-08: 02:00:00 10 mg via INTRAVENOUS
  Filled 2021-07-08: qty 1

## 2021-07-08 MED ORDER — LIDOCAINE VISCOUS HCL 2 % MT SOLN
15.0000 mL | Freq: Once | OROMUCOSAL | Status: AC
  Administered 2021-07-08: 02:00:00 15 mL via OROMUCOSAL
  Filled 2021-07-08: qty 15

## 2021-07-08 MED ORDER — METOCLOPRAMIDE HCL 5 MG/ML IJ SOLN: 10.0000 mg | Freq: Once | INTRAMUSCULAR | Status: DC

## 2021-07-08 NOTE — Discharge Instructions (Addendum)
Your eval patient's today has been reassuring, your labs look good and your viral testing today was negative.  I have very low suspicion this is meningitis.  I think your neck pain is more so related to the swollen lymph nodes.  Your symptoms may be due to mononucleosis, this is a viral infection that can cause prolonged sore throat, headaches and fatigue.  Usually takes 2 weeks before a monotest is positive.  Follow-up with your primary care doctor if your symptoms are not resolving.  Continue to treat your symptoms supportively with Motrin and Tylenol for pain, you can also use over-the-counter cepacol throat lozenges to help with sore throat.  Make sure you are staying well-hydrated return for worsening symptoms.

## 2021-10-01 DIAGNOSIS — Z130 Encounter for screening for diseases of the blood and blood-forming organs and certain disorders involving the immune mechanism: Secondary | ICD-10-CM | POA: Diagnosis not present

## 2021-10-01 DIAGNOSIS — Z01419 Encounter for gynecological examination (general) (routine) without abnormal findings: Secondary | ICD-10-CM | POA: Diagnosis not present

## 2021-10-01 DIAGNOSIS — Z6834 Body mass index (BMI) 34.0-34.9, adult: Secondary | ICD-10-CM | POA: Diagnosis not present

## 2021-10-01 DIAGNOSIS — N920 Excessive and frequent menstruation with regular cycle: Secondary | ICD-10-CM | POA: Diagnosis not present

## 2022-01-08 IMAGING — DX DG CHEST 2V
2 series · 2 of 2 positions shown · non-contrast
Comparison: 12/19/2014

CLINICAL DATA: Chest pain

EXAM:
CHEST - 2 VIEW

[chest pa]
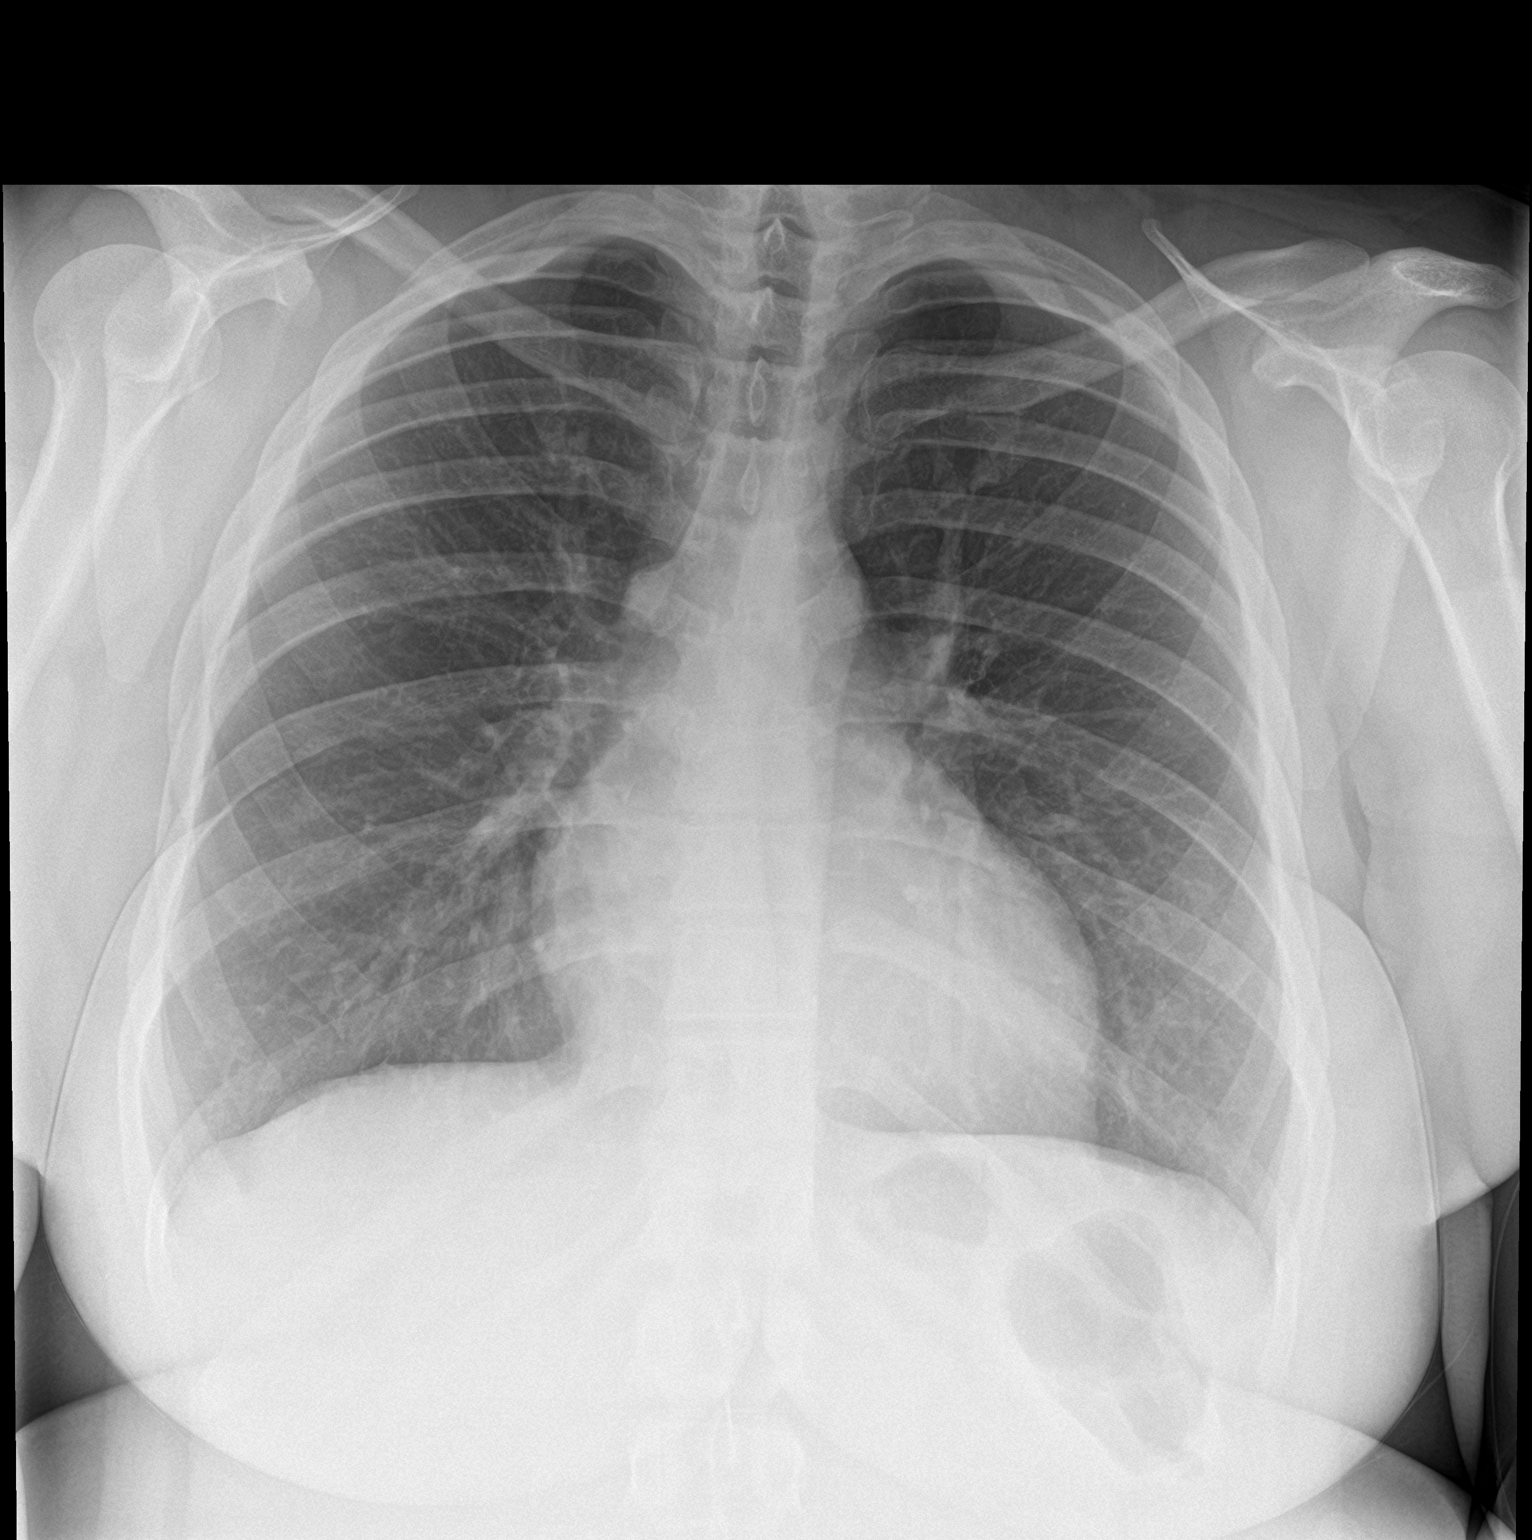

[chest lat]
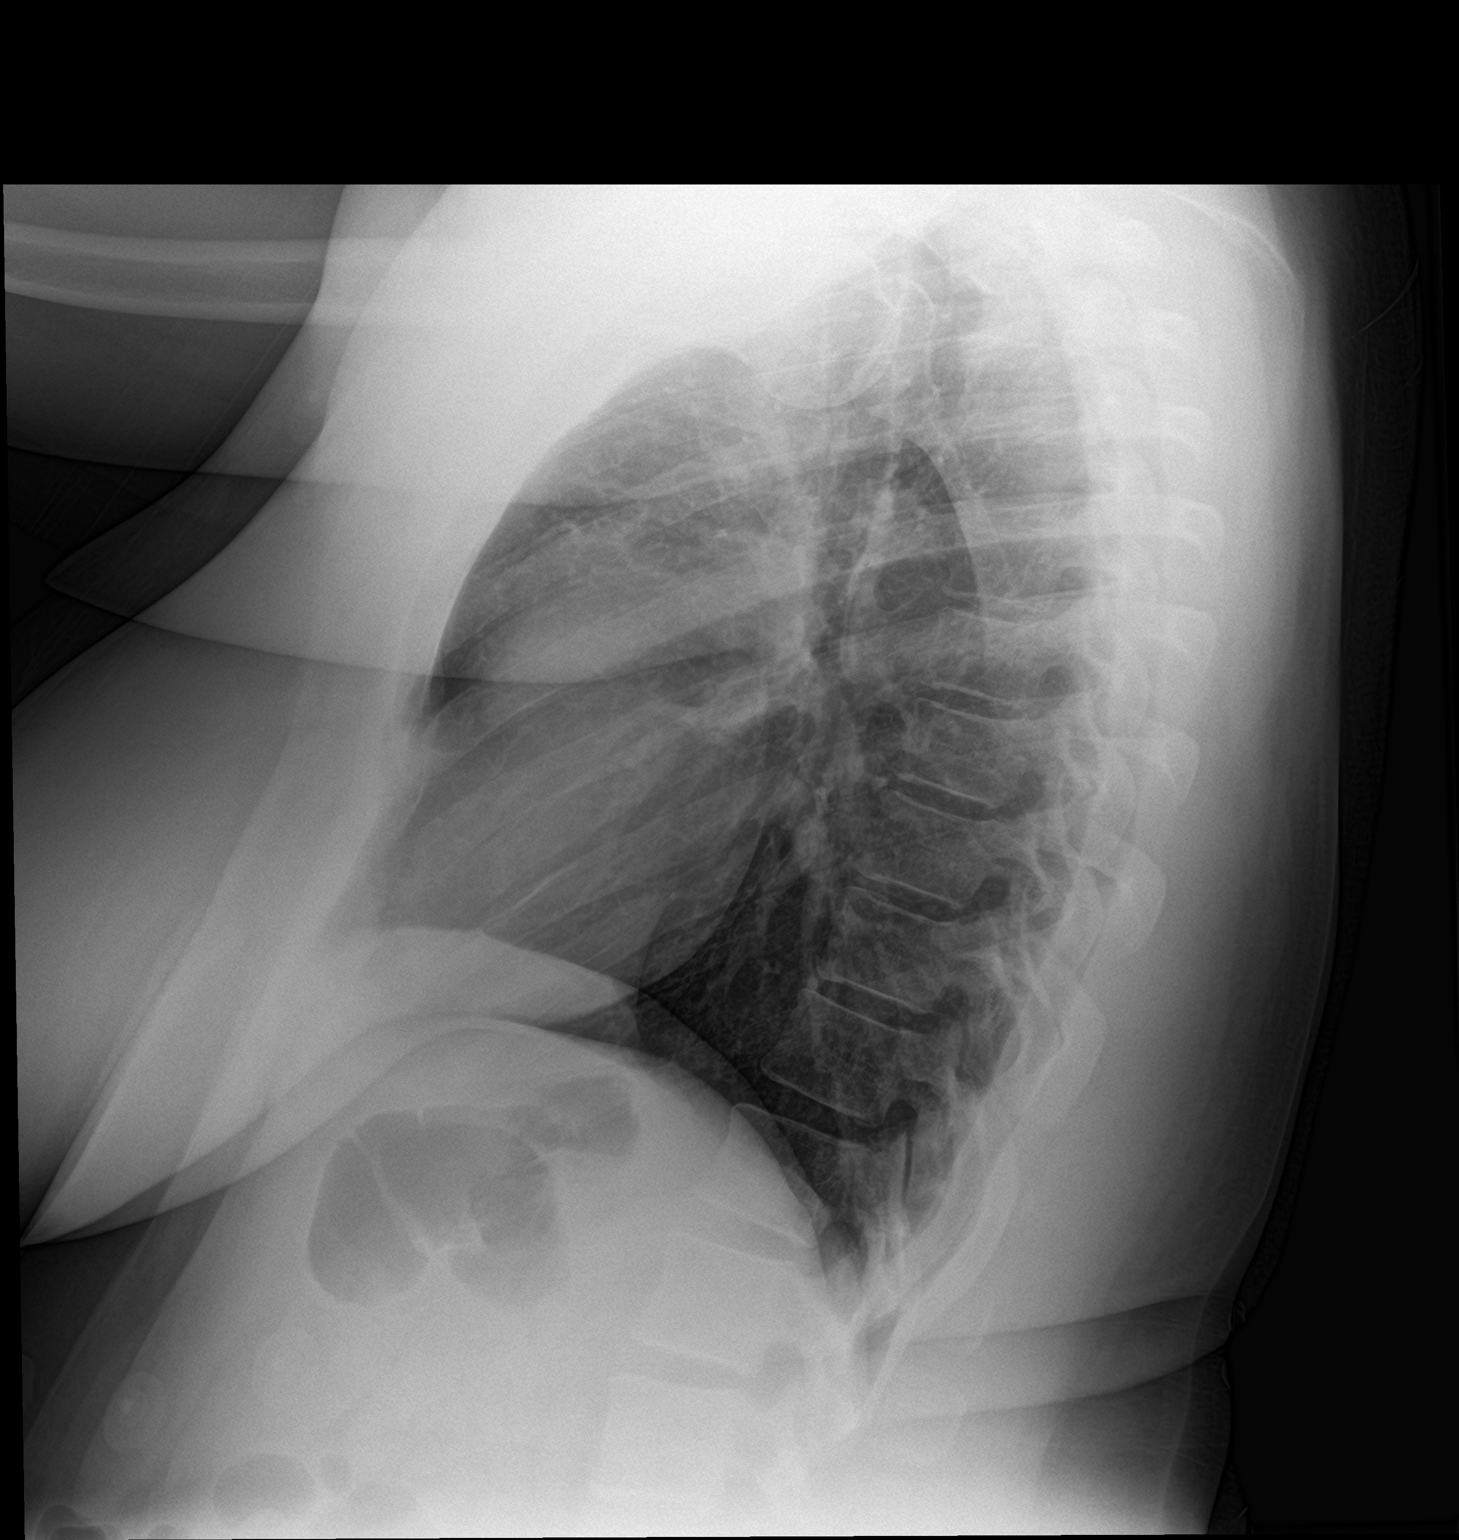

[2 of 2 positions shown; findings below may reference images not displayed]

FINDINGS: The heart size and mediastinal contours are within normal limits.
Both lungs are clear. The visualized skeletal structures are
unremarkable.
IMPRESSION: Normal study.

## 2022-02-06 DIAGNOSIS — M7701 Medial epicondylitis, right elbow: Secondary | ICD-10-CM | POA: Diagnosis not present

## 2022-02-06 DIAGNOSIS — M778 Other enthesopathies, not elsewhere classified: Secondary | ICD-10-CM | POA: Diagnosis not present

## 2022-02-26 DIAGNOSIS — N760 Acute vaginitis: Secondary | ICD-10-CM | POA: Diagnosis not present

## 2022-02-26 DIAGNOSIS — N898 Other specified noninflammatory disorders of vagina: Secondary | ICD-10-CM | POA: Diagnosis not present

## 2022-02-26 DIAGNOSIS — Z3040 Encounter for surveillance of contraceptives, unspecified: Secondary | ICD-10-CM | POA: Diagnosis not present

## 2022-02-26 DIAGNOSIS — Z113 Encounter for screening for infections with a predominantly sexual mode of transmission: Secondary | ICD-10-CM | POA: Diagnosis not present

## 2022-02-26 DIAGNOSIS — Z202 Contact with and (suspected) exposure to infections with a predominantly sexual mode of transmission: Secondary | ICD-10-CM | POA: Diagnosis not present

## 2023-01-01 DIAGNOSIS — Z130 Encounter for screening for diseases of the blood and blood-forming organs and certain disorders involving the immune mechanism: Secondary | ICD-10-CM | POA: Diagnosis not present

## 2023-01-01 DIAGNOSIS — Z1329 Encounter for screening for other suspected endocrine disorder: Secondary | ICD-10-CM | POA: Diagnosis not present

## 2023-01-01 DIAGNOSIS — Z136 Encounter for screening for cardiovascular disorders: Secondary | ICD-10-CM | POA: Diagnosis not present

## 2023-01-01 DIAGNOSIS — N951 Menopausal and female climacteric states: Secondary | ICD-10-CM | POA: Diagnosis not present

## 2023-01-01 DIAGNOSIS — R635 Abnormal weight gain: Secondary | ICD-10-CM | POA: Diagnosis not present

## 2023-01-01 DIAGNOSIS — E559 Vitamin D deficiency, unspecified: Secondary | ICD-10-CM | POA: Diagnosis not present

## 2023-01-01 DIAGNOSIS — Z131 Encounter for screening for diabetes mellitus: Secondary | ICD-10-CM | POA: Diagnosis not present

## 2023-01-14 DIAGNOSIS — R7401 Elevation of levels of liver transaminase levels: Secondary | ICD-10-CM | POA: Diagnosis not present

## 2023-01-14 DIAGNOSIS — Z1331 Encounter for screening for depression: Secondary | ICD-10-CM | POA: Diagnosis not present

## 2023-01-14 DIAGNOSIS — Z6836 Body mass index (BMI) 36.0-36.9, adult: Secondary | ICD-10-CM | POA: Diagnosis not present

## 2023-01-14 DIAGNOSIS — N951 Menopausal and female climacteric states: Secondary | ICD-10-CM | POA: Diagnosis not present

## 2023-01-14 DIAGNOSIS — E611 Iron deficiency: Secondary | ICD-10-CM | POA: Diagnosis not present

## 2023-01-30 DIAGNOSIS — E611 Iron deficiency: Secondary | ICD-10-CM | POA: Diagnosis not present

## 2023-01-30 DIAGNOSIS — Z6836 Body mass index (BMI) 36.0-36.9, adult: Secondary | ICD-10-CM | POA: Diagnosis not present

## 2023-02-13 DIAGNOSIS — Z6835 Body mass index (BMI) 35.0-35.9, adult: Secondary | ICD-10-CM | POA: Diagnosis not present

## 2023-02-13 DIAGNOSIS — E611 Iron deficiency: Secondary | ICD-10-CM | POA: Diagnosis not present

## 2023-02-13 DIAGNOSIS — E559 Vitamin D deficiency, unspecified: Secondary | ICD-10-CM | POA: Diagnosis not present

## 2023-02-19 DIAGNOSIS — E559 Vitamin D deficiency, unspecified: Secondary | ICD-10-CM | POA: Diagnosis not present

## 2023-02-19 DIAGNOSIS — E611 Iron deficiency: Secondary | ICD-10-CM | POA: Diagnosis not present

## 2023-02-19 DIAGNOSIS — Z6835 Body mass index (BMI) 35.0-35.9, adult: Secondary | ICD-10-CM | POA: Diagnosis not present

## 2023-04-02 DIAGNOSIS — Z6833 Body mass index (BMI) 33.0-33.9, adult: Secondary | ICD-10-CM | POA: Diagnosis not present

## 2023-04-02 DIAGNOSIS — E611 Iron deficiency: Secondary | ICD-10-CM | POA: Diagnosis not present

## 2023-04-16 DIAGNOSIS — Z6833 Body mass index (BMI) 33.0-33.9, adult: Secondary | ICD-10-CM | POA: Diagnosis not present

## 2023-04-16 DIAGNOSIS — E559 Vitamin D deficiency, unspecified: Secondary | ICD-10-CM | POA: Diagnosis not present

## 2023-04-16 DIAGNOSIS — E611 Iron deficiency: Secondary | ICD-10-CM | POA: Diagnosis not present

## 2023-04-28 DIAGNOSIS — E559 Vitamin D deficiency, unspecified: Secondary | ICD-10-CM | POA: Diagnosis not present

## 2023-04-28 DIAGNOSIS — Z6832 Body mass index (BMI) 32.0-32.9, adult: Secondary | ICD-10-CM | POA: Diagnosis not present

## 2023-05-05 DIAGNOSIS — E611 Iron deficiency: Secondary | ICD-10-CM | POA: Diagnosis not present

## 2023-05-05 DIAGNOSIS — E291 Testicular hypofunction: Secondary | ICD-10-CM | POA: Diagnosis not present

## 2023-05-05 DIAGNOSIS — Z6832 Body mass index (BMI) 32.0-32.9, adult: Secondary | ICD-10-CM | POA: Diagnosis not present

## 2023-05-11 ENCOUNTER — Ambulatory Visit (HOSPITAL_COMMUNITY)
Admission: EM | Admit: 2023-05-11 | Discharge: 2023-05-11 | Disposition: A | Discharge status: Home / Self Care | Payer: BC Managed Care – PPO | Attending: Internal Medicine | Admitting: Internal Medicine

## 2023-05-11 ENCOUNTER — Encounter (HOSPITAL_COMMUNITY): Payer: Self-pay

## 2023-05-11 DIAGNOSIS — B084 Enteroviral vesicular stomatitis with exanthem: Secondary | ICD-10-CM | POA: Diagnosis not present

## 2023-05-11 NOTE — ED Triage Notes (Signed)
Patient c/o sore throat, headache, and blisters on her hands and feet x 2 days.   Patient states she has been taking TheraFlu.

## 2023-05-11 NOTE — ED Provider Notes (Signed)
[facility]    CSN: [id number] Arrival date & time: 05/11/23  1310      History   Chief Complaint Chief Complaint  Patient presents with   Sore Throat   blisters    HPI Susan Everett is a 32 y.o. female.   The history is provided by the patient.  Sore Throat Pertinent negatives include no headaches.  For 2 days associated with painful sores on both feet.  Her has similar symptoms.  Past Medical History:  Diagnosis Date   Anemia    Hx of chlamydia infection     Patient Active Problem List   Diagnosis Date Noted   Indication for care in labor or delivery 12/26/2017   Normal pregnancy in third trimester 10/14/2016   SVD (spontaneous vaginal delivery) 10/14/2016   Anemia affecting pregnancy in third trimester 08/05/2016    History reviewed. No pertinent surgical history.  OB History     Gravida  3   Para  2   Term  1   Preterm  1   AB  0   Living  2      SAB  0   IAB  0   Ectopic  0   Multiple  0   Live Births  2            Home Medications    Prior to Admission medications   Medication Sig Start Date End Date Taking? Authorizing Provider  levonorgestrel (MIRENA) 20 MCG/DAY IUD 1 each by Intrauterine route once.    [provider]    Family History Family History  Problem Relation Age of Onset   Hypertension Mother    Bronchitis Mother    Healthy Father     Social History Social History   Tobacco Use   Smoking status: Former    Current packs/day: 0.00    Types: Cigarettes    Quit date: 07/22/2010    Years since quitting: 12.8   Smokeless tobacco: Former  Building services engineer status: Never Used  Substance Use Topics   Alcohol use: No   Drug use: Never     Allergies   Patient has no known allergies.   Review of Systems Review of Systems  Constitutional:  Negative for chills, fatigue and fever.  HENT:  Positive for sore throat. Negative for congestion, rhinorrhea and trouble swallowing.    Respiratory:  Negative for cough.   Skin:  Positive for rash.  Neurological:  Negative for headaches.     Physical Exam Triage Vital Signs ED Triage Vitals  Encounter Vitals Group     BP 05/11/23 1339 100/75     Systolic BP Percentile --      Diastolic BP Percentile --      Pulse Rate 05/11/23 1339 81     Resp 05/11/23 1339 16     Temp 05/11/23 1339 98.3 F (36.8 C)     Temp Source 05/11/23 1339 Oral     SpO2 05/11/23 1339 97 %     Weight --      Height --      Head Circumference --      Peak Flow --      Pain Score 05/11/23 1342 5     Pain Loc --      Pain Education --      Exclude from Growth Chart --    No data found.  Updated Vital Signs BP 100/75 (BP Location: Right Arm)   Pulse 81  Temp 98.3 F (36.8 C) (Oral)   Resp 16   LMP 04/11/2023   SpO2 97%   Visual Acuity Right Eye Distance:   Left Eye Distance:   Bilateral Distance:    Right Eye Near:   Left Eye Near:    Bilateral Near:     Physical Exam Vitals and nursing note reviewed.  Constitutional:      Appearance: She is not ill-appearing.  HENT:     Head: Normocephalic.     Right Ear: Tympanic membrane and ear canal normal.     Left Ear: Tympanic membrane and ear canal normal.     Mouth/Throat:     Mouth: Mucous membranes are moist. Oral lesions (Multiple erythematous lesions posterior pharynx soft palate) present.     Pharynx: Uvula midline.     Tonsils: No tonsillar exudate or tonsillar abscesses.  Eyes:     Conjunctiva/sclera: Conjunctivae normal.  Cardiovascular:     Rate and Rhythm: Normal rate and regular rhythm.  Musculoskeletal:     Cervical back: Normal range of motion.  Lymphadenopathy:     Cervical: No cervical adenopathy.  Skin:    Findings: Rash (Multiple macular papular lesions on hands and feet) present.  Neurological:     Mental Status: She is alert.      UC Treatments / Results  Labs (all labs ordered are listed, but only abnormal results are displayed) Labs  Reviewed - No data to display  EKG   Radiology No results found.  Procedures Procedures (including critical care time)  Medications Ordered in UC Medications - No data to display  Initial Impression / Assessment and Plan / UC Course  I have reviewed the triage vital signs and the nursing notes.  Pertinent labs & imaging results that were available during my care of the patient were reviewed by me and considered in my medical decision making (see chart for details).     35 old female with hand-foot-and-mouth disease  OTC meds for symptomatic relief   Final diagnoses:  Hand, foot and mouth disease   Discharge Instructions   None    ED Prescriptions   None    PDMP not reviewed this encounter.   Meliton Rattan, [place] 05/11/23 1404

## 2023-05-19 DIAGNOSIS — Z6832 Body mass index (BMI) 32.0-32.9, adult: Secondary | ICD-10-CM | POA: Diagnosis not present

## 2023-05-19 DIAGNOSIS — E611 Iron deficiency: Secondary | ICD-10-CM | POA: Diagnosis not present

## 2023-06-04 DIAGNOSIS — E611 Iron deficiency: Secondary | ICD-10-CM | POA: Diagnosis not present

## 2023-06-04 DIAGNOSIS — Z6831 Body mass index (BMI) 31.0-31.9, adult: Secondary | ICD-10-CM | POA: Diagnosis not present

## 2023-06-25 DIAGNOSIS — E559 Vitamin D deficiency, unspecified: Secondary | ICD-10-CM | POA: Diagnosis not present

## 2023-06-25 DIAGNOSIS — Z6832 Body mass index (BMI) 32.0-32.9, adult: Secondary | ICD-10-CM | POA: Diagnosis not present

## 2023-06-25 DIAGNOSIS — E611 Iron deficiency: Secondary | ICD-10-CM | POA: Diagnosis not present
